# Patient Record
Sex: Female | Born: 1935 | Race: White | Hispanic: No | Smoking: Former smoker
Health system: Southern US, Community
[De-identification: ages and names within clinical notes are randomized; demographics above are authoritative.]

## PROBLEM LIST (undated history)

## (undated) DIAGNOSIS — I4891 Unspecified atrial fibrillation: Secondary | ICD-10-CM

## (undated) DIAGNOSIS — J45909 Unspecified asthma, uncomplicated: Secondary | ICD-10-CM

## (undated) DIAGNOSIS — Z9109 Other allergy status, other than to drugs and biological substances: Secondary | ICD-10-CM

## (undated) DIAGNOSIS — R42 Dizziness and giddiness: Secondary | ICD-10-CM

## (undated) DIAGNOSIS — R05 Cough: Secondary | ICD-10-CM

## (undated) DIAGNOSIS — I10 Essential (primary) hypertension: Secondary | ICD-10-CM

## (undated) DIAGNOSIS — J449 Chronic obstructive pulmonary disease, unspecified: Secondary | ICD-10-CM

## (undated) DIAGNOSIS — R059 Cough, unspecified: Secondary | ICD-10-CM

## (undated) DIAGNOSIS — I509 Heart failure, unspecified: Secondary | ICD-10-CM

## (undated) DIAGNOSIS — K219 Gastro-esophageal reflux disease without esophagitis: Secondary | ICD-10-CM

## (undated) DIAGNOSIS — M1990 Unspecified osteoarthritis, unspecified site: Secondary | ICD-10-CM

## (undated) HISTORY — PX: OTHER SURGICAL HISTORY: SHX169

## (undated) HISTORY — PX: BREAST BIOPSY: SHX20

## (undated) HISTORY — PX: THYROID SURGERY: SHX805

## (undated) HISTORY — PX: LAPAROSCOPIC OOPHERECTOMY: SHX6507

---

## 2004-06-04 ENCOUNTER — Ambulatory Visit: Payer: Self-pay | Admitting: Gastroenterology

## 2004-07-08 ENCOUNTER — Ambulatory Visit: Payer: Self-pay | Admitting: Gastroenterology

## 2005-02-21 ENCOUNTER — Ambulatory Visit: Payer: Self-pay

## 2005-07-09 ENCOUNTER — Emergency Department: Payer: Self-pay | Admitting: Emergency Medicine

## 2005-07-09 ENCOUNTER — Other Ambulatory Visit: Payer: Self-pay

## 2006-03-14 ENCOUNTER — Ambulatory Visit: Payer: Self-pay

## 2006-11-07 ENCOUNTER — Observation Stay: Payer: Self-pay | Admitting: Internal Medicine

## 2006-11-07 ENCOUNTER — Other Ambulatory Visit: Payer: Self-pay

## 2006-11-08 ENCOUNTER — Other Ambulatory Visit: Payer: Self-pay

## 2006-11-09 ENCOUNTER — Other Ambulatory Visit: Payer: Self-pay

## 2006-11-17 ENCOUNTER — Emergency Department: Payer: Self-pay | Admitting: Internal Medicine

## 2007-02-15 ENCOUNTER — Ambulatory Visit: Payer: Self-pay

## 2007-03-21 ENCOUNTER — Ambulatory Visit: Payer: Self-pay

## 2007-04-22 ENCOUNTER — Other Ambulatory Visit: Payer: Self-pay

## 2007-04-22 ENCOUNTER — Emergency Department: Payer: Self-pay | Admitting: Emergency Medicine

## 2007-11-21 ENCOUNTER — Emergency Department: Payer: Self-pay | Admitting: Emergency Medicine

## 2008-01-21 ENCOUNTER — Other Ambulatory Visit: Payer: Self-pay

## 2008-01-21 ENCOUNTER — Emergency Department: Payer: Self-pay | Admitting: Emergency Medicine

## 2008-01-23 ENCOUNTER — Emergency Department: Payer: Self-pay | Admitting: Emergency Medicine

## 2008-06-01 ENCOUNTER — Emergency Department: Payer: Self-pay | Admitting: Emergency Medicine

## 2008-06-18 ENCOUNTER — Ambulatory Visit: Payer: Self-pay | Admitting: Family Medicine

## 2008-06-20 ENCOUNTER — Ambulatory Visit: Payer: Self-pay | Admitting: Family Medicine

## 2008-07-14 ENCOUNTER — Ambulatory Visit: Payer: Self-pay | Admitting: Gynecologic Oncology

## 2008-07-15 ENCOUNTER — Ambulatory Visit: Payer: Self-pay | Admitting: Gynecologic Oncology

## 2008-08-04 ENCOUNTER — Ambulatory Visit: Payer: Self-pay | Admitting: Obstetrics & Gynecology

## 2008-08-12 ENCOUNTER — Ambulatory Visit: Payer: Self-pay | Admitting: Obstetrics & Gynecology

## 2009-08-05 ENCOUNTER — Ambulatory Visit: Payer: Self-pay | Admitting: Internal Medicine

## 2010-09-21 ENCOUNTER — Ambulatory Visit: Payer: Self-pay | Admitting: Internal Medicine

## 2011-08-26 ENCOUNTER — Ambulatory Visit: Payer: Self-pay | Admitting: Gastroenterology

## 2012-02-08 ENCOUNTER — Ambulatory Visit: Payer: Self-pay | Admitting: Internal Medicine

## 2013-02-12 ENCOUNTER — Ambulatory Visit: Payer: Self-pay | Admitting: Internal Medicine

## 2013-06-28 ENCOUNTER — Ambulatory Visit: Payer: Self-pay | Admitting: Unknown Physician Specialty

## 2014-02-24 ENCOUNTER — Ambulatory Visit: Payer: Self-pay | Admitting: Internal Medicine

## 2014-08-19 DIAGNOSIS — M1712 Unilateral primary osteoarthritis, left knee: Secondary | ICD-10-CM | POA: Diagnosis not present

## 2014-08-27 ENCOUNTER — Ambulatory Visit
Admit: 2014-08-27 | Disposition: A | Discharge status: Home / Self Care | Payer: Self-pay | Attending: Unknown Physician Specialty | Admitting: Unknown Physician Specialty

## 2014-08-27 DIAGNOSIS — M1712 Unilateral primary osteoarthritis, left knee: Secondary | ICD-10-CM | POA: Diagnosis not present

## 2014-08-27 DIAGNOSIS — M25862 Other specified joint disorders, left knee: Secondary | ICD-10-CM | POA: Diagnosis not present

## 2014-08-31 DIAGNOSIS — K921 Melena: Secondary | ICD-10-CM | POA: Diagnosis not present

## 2014-09-03 DIAGNOSIS — J40 Bronchitis, not specified as acute or chronic: Secondary | ICD-10-CM | POA: Diagnosis not present

## 2014-09-03 DIAGNOSIS — I10 Essential (primary) hypertension: Secondary | ICD-10-CM | POA: Diagnosis not present

## 2014-09-03 DIAGNOSIS — I4891 Unspecified atrial fibrillation: Secondary | ICD-10-CM | POA: Diagnosis not present

## 2014-09-10 DIAGNOSIS — J40 Bronchitis, not specified as acute or chronic: Secondary | ICD-10-CM | POA: Diagnosis not present

## 2014-09-10 DIAGNOSIS — R05 Cough: Secondary | ICD-10-CM | POA: Diagnosis not present

## 2014-09-10 DIAGNOSIS — J42 Unspecified chronic bronchitis: Secondary | ICD-10-CM | POA: Diagnosis not present

## 2014-09-15 DIAGNOSIS — I480 Paroxysmal atrial fibrillation: Secondary | ICD-10-CM | POA: Diagnosis not present

## 2014-09-15 DIAGNOSIS — R0602 Shortness of breath: Secondary | ICD-10-CM | POA: Diagnosis not present

## 2014-09-15 DIAGNOSIS — I5022 Chronic systolic (congestive) heart failure: Secondary | ICD-10-CM | POA: Diagnosis not present

## 2014-09-15 DIAGNOSIS — R05 Cough: Secondary | ICD-10-CM | POA: Diagnosis not present

## 2014-09-29 DIAGNOSIS — I10 Essential (primary) hypertension: Secondary | ICD-10-CM | POA: Diagnosis not present

## 2014-09-29 DIAGNOSIS — I5022 Chronic systolic (congestive) heart failure: Secondary | ICD-10-CM | POA: Diagnosis not present

## 2014-09-29 DIAGNOSIS — I480 Paroxysmal atrial fibrillation: Secondary | ICD-10-CM | POA: Diagnosis not present

## 2014-09-29 DIAGNOSIS — I340 Nonrheumatic mitral (valve) insufficiency: Secondary | ICD-10-CM | POA: Diagnosis not present

## 2014-09-29 DIAGNOSIS — R0602 Shortness of breath: Secondary | ICD-10-CM | POA: Diagnosis not present

## 2014-10-07 ENCOUNTER — Other Ambulatory Visit: Payer: Self-pay | Admitting: Unknown Physician Specialty

## 2014-10-07 DIAGNOSIS — M1712 Unilateral primary osteoarthritis, left knee: Secondary | ICD-10-CM

## 2014-10-09 ENCOUNTER — Ambulatory Visit: Admission: RE | Admit: 2014-10-09 | Payer: Commercial Managed Care - HMO | Source: Ambulatory Visit

## 2014-10-16 ENCOUNTER — Ambulatory Visit: Admission: RE | Admit: 2014-10-16 | Payer: Commercial Managed Care - HMO | Source: Ambulatory Visit

## 2014-10-23 ENCOUNTER — Encounter
Admission: RE | Admit: 2014-10-23 | Discharge: 2014-10-23 | Disposition: A | Discharge status: Home / Self Care | Payer: Commercial Managed Care - HMO | Source: Ambulatory Visit | Attending: Unknown Physician Specialty | Admitting: Unknown Physician Specialty

## 2014-10-23 DIAGNOSIS — Z809 Family history of malignant neoplasm, unspecified: Secondary | ICD-10-CM | POA: Insufficient documentation

## 2014-10-23 DIAGNOSIS — I10 Essential (primary) hypertension: Secondary | ICD-10-CM | POA: Diagnosis not present

## 2014-10-23 DIAGNOSIS — Z01812 Encounter for preprocedural laboratory examination: Secondary | ICD-10-CM | POA: Diagnosis not present

## 2014-10-23 DIAGNOSIS — K219 Gastro-esophageal reflux disease without esophagitis: Secondary | ICD-10-CM | POA: Insufficient documentation

## 2014-10-23 DIAGNOSIS — I4891 Unspecified atrial fibrillation: Secondary | ICD-10-CM | POA: Insufficient documentation

## 2014-10-23 DIAGNOSIS — Z881 Allergy status to other antibiotic agents status: Secondary | ICD-10-CM | POA: Insufficient documentation

## 2014-10-23 DIAGNOSIS — Z8249 Family history of ischemic heart disease and other diseases of the circulatory system: Secondary | ICD-10-CM | POA: Diagnosis not present

## 2014-10-23 DIAGNOSIS — M1712 Unilateral primary osteoarthritis, left knee: Secondary | ICD-10-CM | POA: Diagnosis not present

## 2014-10-23 DIAGNOSIS — Z888 Allergy status to other drugs, medicaments and biological substances status: Secondary | ICD-10-CM | POA: Insufficient documentation

## 2014-10-23 DIAGNOSIS — J449 Chronic obstructive pulmonary disease, unspecified: Secondary | ICD-10-CM | POA: Diagnosis not present

## 2014-10-23 HISTORY — DX: Gastro-esophageal reflux disease without esophagitis: K21.9

## 2014-10-23 HISTORY — DX: Cough, unspecified: R05.9

## 2014-10-23 HISTORY — DX: Cough: R05

## 2014-10-23 HISTORY — DX: Chronic obstructive pulmonary disease, unspecified: J44.9

## 2014-10-23 HISTORY — DX: Unspecified osteoarthritis, unspecified site: M19.90

## 2014-10-23 HISTORY — DX: Other allergy status, other than to drugs and biological substances: Z91.09

## 2014-10-23 HISTORY — DX: Unspecified atrial fibrillation: I48.91

## 2014-10-23 HISTORY — DX: Essential (primary) hypertension: I10

## 2014-10-23 LAB — CBC
HCT: 36.6 % (ref 35.0–47.0)
HEMOGLOBIN: 12.5 g/dL (ref 12.0–16.0)
MCH: 33.2 pg (ref 26.0–34.0)
MCHC: 34.1 g/dL (ref 32.0–36.0)
MCV: 97.3 fL (ref 80.0–100.0)
PLATELETS: 215 10*3/uL (ref 150–440)
RBC: 3.76 MIL/uL — ABNORMAL LOW (ref 3.80–5.20)
RDW: 12.8 % (ref 11.5–14.5)
WBC: 4 10*3/uL (ref 3.6–11.0)

## 2014-10-23 LAB — SURGICAL PCR SCREEN
MRSA, PCR: NEGATIVE
STAPHYLOCOCCUS AUREUS: NEGATIVE

## 2014-10-23 LAB — URINALYSIS COMPLETE WITH MICROSCOPIC (ARMC ONLY)
Bacteria, UA: NONE SEEN
Bilirubin Urine: NEGATIVE
Glucose, UA: NEGATIVE mg/dL
Hgb urine dipstick: NEGATIVE
Ketones, ur: NEGATIVE mg/dL
LEUKOCYTES UA: NEGATIVE
Nitrite: NEGATIVE
PROTEIN: NEGATIVE mg/dL
SPECIFIC GRAVITY, URINE: 1.006 (ref 1.005–1.030)
pH: 7 (ref 5.0–8.0)

## 2014-10-23 LAB — BASIC METABOLIC PANEL
Anion gap: 9 (ref 5–15)
BUN: 13 mg/dL (ref 6–20)
CHLORIDE: 99 mmol/L — AB (ref 101–111)
CO2: 29 mmol/L (ref 22–32)
Calcium: 9.7 mg/dL (ref 8.9–10.3)
Creatinine, Ser: 0.66 mg/dL (ref 0.44–1.00)
GFR calc Af Amer: >60 mL/min (ref >60)
Glucose, Bld: 103 mg/dL — ABNORMAL HIGH (ref 65–99)
Potassium: 3.4 mmol/L — ABNORMAL LOW (ref 3.5–5.1)
SODIUM: 137 mmol/L (ref 135–145)

## 2014-10-23 LAB — APTT: APTT: 30 s (ref 24–36)

## 2014-10-23 LAB — ABO/RH: ABO/RH(D): A POS

## 2014-10-23 LAB — PROTIME-INR
INR: 1.01
Prothrombin Time: 13.5 seconds (ref 11.4–15.0)

## 2014-10-23 NOTE — Patient Instructions (Signed)
  Your procedure is scheduled on: 11/05/14 Wed Report to Day Surgery. To find out your arrival time please call [phone] between 1PM - 3PM on 11/04/14 Tues.  Remember: Instructions that are not followed completely may result in serious medical risk, up to and including death, or upon the discretion of your surgeon and anesthesiologist your surgery may need to be rescheduled.    _x___ 1. Do not eat food or drink liquids after midnight. No gum chewing or hard candies.     ____ 2. No Alcohol for 24 hours before or after surgery.   ____ 3. Bring all medications with you on the day of surgery if instructed.    __x__ 4. Notify your doctor if there is any change in your medical condition     (cold, fever, infections).     Do not wear jewelry, make-up, hairpins, clips or nail polish.  Do not wear lotions, powders, or perfumes. You may wear deodorant.  Do not shave 48 hours prior to surgery. Men may shave face and neck.  Do not bring valuables to the hospital.    [facility] is not responsible for any belongings or valuables.               Contacts, dentures or bridgework may not be worn into surgery.  Leave your suitcase in the car. After surgery it may be brought to your room.  For patients admitted to the hospital, discharge time is determined by your                treatment team.   Patients discharged the day of surgery will not be allowed to drive home.   Please read over the following fact sheets that you were given:   MRSA Information   ____ Take these medicines the morning of surgery with A SIP OF WATER:    1. busPIRone (BUSPAR) 5 MG tablet  2. digoxin (LANOXIN) 0.25 MG tablet  3. metoprolol succinate (TOPROL-XL) 50 MG 24 hr tablet  4.pantoprazole (PROTONIX) 40 MG tablet  5.  6.  ____ Fleet Enema (as directed)   _x__ Use CHG Soap as directed  ____ Use inhalers on the day of surgery  ____ Stop metformin 2 days prior to surgery    ____ Take 1/2 of usual insulin dose  the night before surgery and none on the morning of surgery.   ____ Stop Coumadin/Plavix/aspirin on stop aspirin 7 days before surgery  ____ Stop Anti-inflammatories on    ____ Stop supplements until after surgery.    ____ Bring C-Pap to the hospital.

## 2014-11-05 ENCOUNTER — Inpatient Hospital Stay: Payer: Commercial Managed Care - HMO | Admitting: Certified Registered Nurse Anesthetist

## 2014-11-05 ENCOUNTER — Inpatient Hospital Stay: Payer: Commercial Managed Care - HMO

## 2014-11-05 ENCOUNTER — Inpatient Hospital Stay
Admission: RE | Admit: 2014-11-05 | Discharge: 2014-11-08 | DRG: 470 | Disposition: A | Discharge status: Skilled Nursing Facility | Payer: Commercial Managed Care - HMO | Source: Ambulatory Visit | Attending: Unknown Physician Specialty | Admitting: Unknown Physician Specialty

## 2014-11-05 ENCOUNTER — Encounter: Payer: Self-pay | Admitting: *Deleted

## 2014-11-05 ENCOUNTER — Encounter
Admission: RE | Disposition: A | Discharge status: Skilled Nursing Facility | Payer: Self-pay | Source: Ambulatory Visit | Attending: Unknown Physician Specialty

## 2014-11-05 DIAGNOSIS — M1712 Unilateral primary osteoarthritis, left knee: Principal | ICD-10-CM | POA: Diagnosis present

## 2014-11-05 DIAGNOSIS — E039 Hypothyroidism, unspecified: Secondary | ICD-10-CM | POA: Diagnosis present

## 2014-11-05 DIAGNOSIS — E871 Hypo-osmolality and hyponatremia: Secondary | ICD-10-CM | POA: Diagnosis not present

## 2014-11-05 DIAGNOSIS — I4891 Unspecified atrial fibrillation: Secondary | ICD-10-CM | POA: Diagnosis present

## 2014-11-05 DIAGNOSIS — M810 Age-related osteoporosis without current pathological fracture: Secondary | ICD-10-CM | POA: Diagnosis not present

## 2014-11-05 DIAGNOSIS — Z419 Encounter for procedure for purposes other than remedying health state, unspecified: Secondary | ICD-10-CM

## 2014-11-05 DIAGNOSIS — Z96659 Presence of unspecified artificial knee joint: Secondary | ICD-10-CM

## 2014-11-05 DIAGNOSIS — M6281 Muscle weakness (generalized): Secondary | ICD-10-CM | POA: Diagnosis not present

## 2014-11-05 DIAGNOSIS — E119 Type 2 diabetes mellitus without complications: Secondary | ICD-10-CM | POA: Diagnosis not present

## 2014-11-05 DIAGNOSIS — M1990 Unspecified osteoarthritis, unspecified site: Secondary | ICD-10-CM | POA: Diagnosis not present

## 2014-11-05 DIAGNOSIS — Z96652 Presence of left artificial knee joint: Secondary | ICD-10-CM | POA: Diagnosis not present

## 2014-11-05 DIAGNOSIS — Z87891 Personal history of nicotine dependence: Secondary | ICD-10-CM

## 2014-11-05 DIAGNOSIS — Z7982 Long term (current) use of aspirin: Secondary | ICD-10-CM | POA: Diagnosis not present

## 2014-11-05 DIAGNOSIS — J449 Chronic obstructive pulmonary disease, unspecified: Secondary | ICD-10-CM | POA: Diagnosis present

## 2014-11-05 DIAGNOSIS — R2689 Other abnormalities of gait and mobility: Secondary | ICD-10-CM | POA: Diagnosis not present

## 2014-11-05 DIAGNOSIS — M179 Osteoarthritis of knee, unspecified: Secondary | ICD-10-CM | POA: Diagnosis not present

## 2014-11-05 DIAGNOSIS — K219 Gastro-esophageal reflux disease without esophagitis: Secondary | ICD-10-CM | POA: Diagnosis not present

## 2014-11-05 DIAGNOSIS — I10 Essential (primary) hypertension: Secondary | ICD-10-CM | POA: Diagnosis not present

## 2014-11-05 DIAGNOSIS — Z471 Aftercare following joint replacement surgery: Secondary | ICD-10-CM | POA: Diagnosis not present

## 2014-11-05 HISTORY — PX: TOTAL KNEE ARTHROPLASTY: SHX125

## 2014-11-05 LAB — TYPE AND SCREEN
ABO/RH(D): A POS
ANTIBODY SCREEN: NEGATIVE
Unit division: 0
Unit division: 0

## 2014-11-05 LAB — CREATININE, SERUM
Creatinine, Ser: 0.66 mg/dL (ref 0.44–1.00)
GFR calc Af Amer: >60 mL/min (ref >60)

## 2014-11-05 LAB — CBC
HCT: 35.5 % (ref 35.0–47.0)
Hemoglobin: 12.2 g/dL (ref 12.0–16.0)
MCH: 33.8 pg (ref 26.0–34.0)
MCHC: 34.4 g/dL (ref 32.0–36.0)
MCV: 98.5 fL (ref 80.0–100.0)
Platelets: 200 10*3/uL (ref 150–440)
RBC: 3.6 MIL/uL — AB (ref 3.80–5.20)
RDW: 13.3 % (ref 11.5–14.5)
WBC: 10.6 10*3/uL (ref 3.6–11.0)

## 2014-11-05 LAB — PREPARE RBC (CROSSMATCH)

## 2014-11-05 SURGERY — ARTHROPLASTY, KNEE, TOTAL
Anesthesia: Spinal | Laterality: Left | Wound class: Clean

## 2014-11-05 MED ORDER — PHENOL 1.4 % MT LIQD: 1.0000 | OROMUCOSAL | Status: DC | PRN

## 2014-11-05 MED ORDER — POLYETHYLENE GLYCOL 3350 17 G PO PACK
17.0000 g | PACK | Freq: Every day | ORAL | Status: DC | PRN
  Administered 2014-11-06 – 2014-11-07 (×2): 17 g via ORAL
  Filled 2014-11-05 (×2): qty 1

## 2014-11-05 MED ORDER — ONDANSETRON HCL 4 MG PO TABS: 4.0000 mg | ORAL_TABLET | Freq: Four times a day (QID) | ORAL | Status: DC | PRN

## 2014-11-05 MED ORDER — RALOXIFENE HCL 60 MG PO TABS
60.0000 mg | ORAL_TABLET | Freq: Every day | ORAL | Status: DC
  Administered 2014-11-05 – 2014-11-08 (×4): 60 mg via ORAL
  Filled 2014-11-05 (×4): qty 1

## 2014-11-05 MED ORDER — FENTANYL CITRATE (PF) 100 MCG/2ML IJ SOLN
25.0000 ug | INTRAMUSCULAR | Status: DC | PRN
  Administered 2014-11-05 (×4): 25 ug via INTRAVENOUS

## 2014-11-05 MED ORDER — KCL IN DEXTROSE-NACL 20-5-0.45 MEQ/L-%-% IV SOLN
INTRAVENOUS | Status: DC
  Administered 2014-11-05 – 2014-11-06 (×2): via INTRAVENOUS
  Filled 2014-11-05 (×4): qty 1000

## 2014-11-05 MED ORDER — SODIUM CHLORIDE 0.9 % IJ SOLN
INTRAMUSCULAR | Status: AC
  Filled 2014-11-05: qty 50

## 2014-11-05 MED ORDER — ACETAMINOPHEN 650 MG RE SUPP: 650.0000 mg | Freq: Four times a day (QID) | RECTAL | Status: DC | PRN

## 2014-11-05 MED ORDER — TRANEXAMIC ACID 1000 MG/10ML IV SOLN: 1000.0000 mg | INTRAVENOUS | Status: DC | PRN

## 2014-11-05 MED ORDER — CEFAZOLIN SODIUM 1-5 GM-% IV SOLN
INTRAVENOUS | Status: AC
  Filled 2014-11-05: qty 50

## 2014-11-05 MED ORDER — LIDOCAINE HCL (CARDIAC) 20 MG/ML IV SOLN
INTRAVENOUS | Status: DC | PRN
  Administered 2014-11-05: 60 mg via INTRAVENOUS

## 2014-11-05 MED ORDER — FENTANYL CITRATE (PF) 100 MCG/2ML IJ SOLN
INTRAMUSCULAR | Status: DC | PRN
  Administered 2014-11-05 (×4): 50 ug via INTRAVENOUS

## 2014-11-05 MED ORDER — TRANEXAMIC ACID 1000 MG/10ML IV SOLN
INTRAVENOUS | Status: AC
  Filled 2014-11-05: qty 10

## 2014-11-05 MED ORDER — ONDANSETRON HCL 4 MG/2ML IJ SOLN
4.0000 mg | Freq: Four times a day (QID) | INTRAMUSCULAR | Status: DC | PRN
  Administered 2014-11-05 – 2014-11-06 (×2): 4 mg via INTRAVENOUS
  Filled 2014-11-05: qty 2

## 2014-11-05 MED ORDER — BUPIVACAINE LIPOSOME 1.3 % IJ SUSP
INTRAMUSCULAR | Status: AC
  Filled 2014-11-05: qty 20

## 2014-11-05 MED ORDER — ACETAMINOPHEN 325 MG PO TABS
650.0000 mg | ORAL_TABLET | Freq: Four times a day (QID) | ORAL | Status: DC | PRN
  Administered 2014-11-05 – 2014-11-08 (×3): 650 mg via ORAL
  Filled 2014-11-05 (×3): qty 2

## 2014-11-05 MED ORDER — BUPIVACAINE-EPINEPHRINE (PF) 0.5% -1:200000 IJ SOLN
INTRAMUSCULAR | Status: AC
  Filled 2014-11-05: qty 30

## 2014-11-05 MED ORDER — HYDROMORPHONE HCL 1 MG/ML IJ SOLN
0.5000 mg | INTRAMUSCULAR | Status: AC | PRN
  Administered 2014-11-05 (×4): 0.5 mg via INTRAVENOUS

## 2014-11-05 MED ORDER — ONDANSETRON HCL 4 MG/2ML IJ SOLN: 4.0000 mg | Freq: Once | INTRAMUSCULAR | Status: DC | PRN

## 2014-11-05 MED ORDER — CEFAZOLIN SODIUM 1-5 GM-% IV SOLN
INTRAVENOUS | Status: AC
  Administered 2014-11-05: 1 g via INTRAVENOUS
  Filled 2014-11-05: qty 50

## 2014-11-05 MED ORDER — BENZONATATE 100 MG PO CAPS: 100.0000 mg | ORAL_CAPSULE | Freq: Three times a day (TID) | ORAL | Status: DC | PRN

## 2014-11-05 MED ORDER — GLYCOPYRROLATE 0.2 MG/ML IJ SOLN
INTRAMUSCULAR | Status: DC | PRN
  Administered 2014-11-05 (×2): 0.1 mg via INTRAVENOUS

## 2014-11-05 MED ORDER — HYDROMORPHONE HCL 1 MG/ML IJ SOLN
INTRAMUSCULAR | Status: AC
  Administered 2014-11-05: 0.5 mg via INTRAVENOUS
  Filled 2014-11-05: qty 1

## 2014-11-05 MED ORDER — DIGOXIN 125 MCG PO TABS
0.2500 mg | ORAL_TABLET | Freq: Every day | ORAL | Status: DC
  Administered 2014-11-06 – 2014-11-08 (×3): 0.25 mg via ORAL
  Filled 2014-11-05 (×3): qty 2

## 2014-11-05 MED ORDER — OXYCODONE HCL 5 MG PO TABS
5.0000 mg | ORAL_TABLET | ORAL | Status: DC | PRN
  Administered 2014-11-05 – 2014-11-06 (×2): 5 mg via ORAL
  Administered 2014-11-06: 10 mg via ORAL
  Administered 2014-11-06: 5 mg via ORAL
  Administered 2014-11-06 (×2): 10 mg via ORAL
  Administered 2014-11-07 – 2014-11-08 (×6): 5 mg via ORAL
  Filled 2014-11-05 (×4): qty 1
  Filled 2014-11-05 (×2): qty 2
  Filled 2014-11-05 (×3): qty 1
  Filled 2014-11-05: qty 2
  Filled 2014-11-05: qty 1
  Filled 2014-11-05: qty 2
  Filled 2014-11-05: qty 1

## 2014-11-05 MED ORDER — ENOXAPARIN SODIUM 30 MG/0.3ML ~~LOC~~ SOLN
30.0000 mg | Freq: Two times a day (BID) | SUBCUTANEOUS | Status: DC
  Administered 2014-11-06 – 2014-11-08 (×5): 30 mg via SUBCUTANEOUS
  Filled 2014-11-05 (×5): qty 0.3

## 2014-11-05 MED ORDER — BUPIVACAINE LIPOSOME 1.3 % IJ SUSP
INTRAMUSCULAR | Status: DC | PRN
  Administered 2014-11-05: 60 mL

## 2014-11-05 MED ORDER — ACETAMINOPHEN 10 MG/ML IV SOLN
INTRAVENOUS | Status: AC
  Filled 2014-11-05: qty 100

## 2014-11-05 MED ORDER — NEOMYCIN-POLYMYXIN B GU 40-200000 IR SOLN
Status: DC | PRN
  Administered 2014-11-05: 16 mL

## 2014-11-05 MED ORDER — SODIUM CHLORIDE 0.9 % IV SOLN
10000.0000 ug | INTRAVENOUS | Status: DC | PRN
  Administered 2014-11-05: 50 ug/min via INTRAVENOUS

## 2014-11-05 MED ORDER — NEOMYCIN-POLYMYXIN B GU 40-200000 IR SOLN
Status: AC
  Filled 2014-11-05: qty 20

## 2014-11-05 MED ORDER — ALPRAZOLAM 0.25 MG PO TABS
0.2500 mg | ORAL_TABLET | Freq: Two times a day (BID) | ORAL | Status: DC | PRN
  Administered 2014-11-06: 0.25 mg via ORAL
  Filled 2014-11-05: qty 1

## 2014-11-05 MED ORDER — TRANEXAMIC ACID 1000 MG/10ML IV SOLN
1000.0000 mg | INTRAVENOUS | Status: DC | PRN
  Administered 2014-11-05: 1000 mg via INTRAVENOUS

## 2014-11-05 MED ORDER — ACETAMINOPHEN 10 MG/ML IV SOLN
INTRAVENOUS | Status: DC | PRN
  Administered 2014-11-05: 1000 mg via INTRAVENOUS

## 2014-11-05 MED ORDER — CEFAZOLIN SODIUM 1-5 GM-% IV SOLN
1.0000 g | Freq: Three times a day (TID) | INTRAVENOUS | Status: AC
  Administered 2014-11-05 – 2014-11-06 (×3): 1 g via INTRAVENOUS
  Filled 2014-11-05 (×3): qty 50

## 2014-11-05 MED ORDER — BUPIVACAINE IN DEXTROSE 0.75-8.25 % IT SOLN
INTRATHECAL | Status: DC | PRN
  Administered 2014-11-05: 2 mL via INTRATHECAL

## 2014-11-05 MED ORDER — BUSPIRONE HCL 10 MG PO TABS
5.0000 mg | ORAL_TABLET | Freq: Two times a day (BID) | ORAL | Status: DC
Start: 2014-11-05 — End: 2014-11-08
  Administered 2014-11-05 – 2014-11-08 (×6): 5 mg via ORAL
  Filled 2014-11-05 (×6): qty 1

## 2014-11-05 MED ORDER — HYDROMORPHONE HCL 1 MG/ML IJ SOLN
1.0000 mg | INTRAMUSCULAR | Status: DC | PRN
  Administered 2014-11-05: 1 mg via INTRAVENOUS
  Filled 2014-11-05: qty 1

## 2014-11-05 MED ORDER — LOSARTAN POTASSIUM 50 MG PO TABS
100.0000 mg | ORAL_TABLET | Freq: Every day | ORAL | Status: DC
  Administered 2014-11-05 – 2014-11-08 (×4): 100 mg via ORAL
  Filled 2014-11-05 (×4): qty 2

## 2014-11-05 MED ORDER — HYDROCHLOROTHIAZIDE 12.5 MG PO CAPS
12.5000 mg | ORAL_CAPSULE | Freq: Every day | ORAL | Status: DC
  Administered 2014-11-05 – 2014-11-08 (×4): 12.5 mg via ORAL
  Filled 2014-11-05 (×4): qty 1

## 2014-11-05 MED ORDER — LOSARTAN POTASSIUM-HCTZ 100-12.5 MG PO TABS: 1.0000 | ORAL_TABLET | Freq: Every day | ORAL | Status: DC

## 2014-11-05 MED ORDER — MENTHOL 3 MG MT LOZG: 1.0000 | LOZENGE | OROMUCOSAL | Status: DC | PRN

## 2014-11-05 MED ORDER — PANTOPRAZOLE SODIUM 40 MG PO TBEC
40.0000 mg | DELAYED_RELEASE_TABLET | Freq: Every day | ORAL | Status: DC
  Administered 2014-11-06 – 2014-11-08 (×3): 40 mg via ORAL
  Filled 2014-11-05 (×3): qty 1

## 2014-11-05 MED ORDER — LACTATED RINGERS IV SOLN
INTRAVENOUS | Status: DC
  Administered 2014-11-05: 07:00:00 via INTRAVENOUS

## 2014-11-05 MED ORDER — PROPOFOL INFUSION 10 MG/ML OPTIME
INTRAVENOUS | Status: DC | PRN
  Administered 2014-11-05: 50 ug/kg/min via INTRAVENOUS

## 2014-11-05 MED ORDER — METOPROLOL SUCCINATE ER 50 MG PO TB24
50.0000 mg | ORAL_TABLET | Freq: Every day | ORAL | Status: DC
  Administered 2014-11-06 – 2014-11-08 (×3): 50 mg via ORAL
  Filled 2014-11-05 (×3): qty 1

## 2014-11-05 MED ORDER — FENTANYL CITRATE (PF) 100 MCG/2ML IJ SOLN
INTRAMUSCULAR | Status: AC
  Administered 2014-11-05: 25 ug via INTRAVENOUS
  Filled 2014-11-05: qty 2

## 2014-11-05 SURGICAL SUPPLY — 53 items
BLADE SAGITTAL AGGR TOOTH XLG (BLADE) ×3 IMPLANT
BLADE SAW 1/2 (BLADE) ×3 IMPLANT
BLADE SAW SAG 29X58X.64 (BLADE) ×3 IMPLANT
BLADE SURG 15 STRL LF DISP TIS (BLADE) ×1 IMPLANT
BLADE SURG 15 STRL SS (BLADE) ×2
BNDG COHESIVE 6X5 TAN STRL LF (GAUZE/BANDAGES/DRESSINGS) ×3 IMPLANT
BOWL CEMENT MIXING ADV NOZZLE (MISCELLANEOUS) ×3 IMPLANT
CANISTER SUCT 1200ML W/VALVE (MISCELLANEOUS) ×3 IMPLANT
CANISTER SUCT 3000ML (MISCELLANEOUS) ×6 IMPLANT
CAPT KNEE TOTAL 3 ×3 IMPLANT
CATH TRAY 16F METER LATEX (MISCELLANEOUS) ×3 IMPLANT
CEMENT BONE GENTAMICIN (Cement) ×6 IMPLANT
CEMENT BONE GENTAMICIN PWDR (Cement) ×2 IMPLANT
CHLORAPREP W/TINT 26ML (MISCELLANEOUS) ×6 IMPLANT
COOLER POLAR GLACIER W/PUMP (MISCELLANEOUS) ×3 IMPLANT
DRAPE INCISE IOBAN 66X45 STRL (DRAPES) ×3 IMPLANT
DRAPE SHEET LG 3/4 BI-LAMINATE (DRAPES) ×3 IMPLANT
GAUZE PETRO XEROFOAM 1X8 (MISCELLANEOUS) ×3 IMPLANT
GAUZE SPONGE 4X4 12PLY STRL (GAUZE/BANDAGES/DRESSINGS) ×3 IMPLANT
GLOVE BIO SURGEON STRL SZ8 (GLOVE) ×12 IMPLANT
GLOVE BIOGEL M STRL SZ7.5 (GLOVE) ×6 IMPLANT
GLOVE INDICATOR 8.0 STRL GRN (GLOVE) ×6 IMPLANT
GOWN STRL REUS W/ TWL LRG LVL3 (GOWN DISPOSABLE) ×2 IMPLANT
GOWN STRL REUS W/TWL LRG LVL3 (GOWN DISPOSABLE) ×4
GOWN STRL REUS W/TWL LRG LVL4 (GOWN DISPOSABLE) ×6 IMPLANT
HANDPIECE SUCTION TUBG SURGILV (MISCELLANEOUS) ×3 IMPLANT
HOOD PEEL AWAY FACE SHEILD DIS (HOOD) ×6 IMPLANT
IMMBOLIZER KNEE 19 BLUE UNIV (SOFTGOODS) ×3 IMPLANT
IRRIGATION STRYKERFLOW (MISCELLANEOUS) ×1 IMPLANT
IRRIGATOR STRYKERFLOW (MISCELLANEOUS) ×3
KIT RM TURNOVER STRD PROC AR (KITS) ×3 IMPLANT
NDL SAFETY 18GX1.5 (NEEDLE) ×3 IMPLANT
NDL SAFETY 22GX1.5 (NEEDLE) ×3 IMPLANT
NEEDLE SPNL 20GX3.5 QUINCKE YW (NEEDLE) ×6 IMPLANT
NS IRRIG 1000ML POUR BTL (IV SOLUTION) ×3 IMPLANT
PACK TOTAL KNEE (MISCELLANEOUS) ×3 IMPLANT
PAD ABD DERMACEA PRESS 5X9 (GAUZE/BANDAGES/DRESSINGS) ×3 IMPLANT
PAD GROUND ADULT SPLIT (MISCELLANEOUS) ×3 IMPLANT
PAD WRAPON POLAR KNEE (MISCELLANEOUS) ×1 IMPLANT
SOL .9 NS 3000ML IRR  AL (IV SOLUTION) ×2
SOL .9 NS 3000ML IRR UROMATIC (IV SOLUTION) ×1 IMPLANT
STAPLER SKIN PROX 35W (STAPLE) ×3 IMPLANT
SUCTION FRAZIER TIP 10 FR DISP (SUCTIONS) ×6 IMPLANT
SUT ETHIBOND NAB CT1 #1 30IN (SUTURE) ×9 IMPLANT
SUT VIC AB 0 CT1 27 (SUTURE) ×2
SUT VIC AB 0 CT1 27XCR 8 STRN (SUTURE) ×1 IMPLANT
SUT VIC AB 2-0 CT1 27 (SUTURE) ×4
SUT VIC AB 2-0 CT1 TAPERPNT 27 (SUTURE) ×2 IMPLANT
SYR 20CC LL (SYRINGE) ×3 IMPLANT
SYR 30ML LL (SYRINGE) ×3 IMPLANT
SYR 50ML LL SCALE MARK (SYRINGE) ×3 IMPLANT
WATER STERILE IRR 1000ML POUR (IV SOLUTION) ×3 IMPLANT
WRAPON POLAR PAD KNEE (MISCELLANEOUS) ×3

## 2014-11-05 NOTE — H&P (Signed)
  H and P reviewed. No changes. Uploaded at later date. 

## 2014-11-05 NOTE — Op Note (Signed)
DATE OF SURGERY:  11/05/2014  PATIENT NAME:  Natalie Schneider   DOB: [birth year]  MRN: [id number]  PRE-OPERATIVE DIAGNOSIS: Degenerative arthrosis of the left knee, primary  POST-OPERATIVE DIAGNOSIS:  Degenerative arthrosis of the left knee, primary.  PROCEDURE:  Left total knee arthroplasty  SURGEON:  Dr.Chanel Mckesson Royann Shivers. M.D.  ASSISTANT:    ANESTHESIA: Spinal  ESTIMATED BLOOD LOSS: 50  mL  TOURNIQUET TIME: 124 minutes   IMPLANTS UTILIZED: Stryker triathlon #3 posterior stabilized femoral component, #3 universal tibial baseplate, #3 nine millimeter tibial insert, A29 mm by  9mm asymmetric patella  INDICATIONS FOR SURGERY: Natalie Schneider is a 81 y.o. year old female with a long history of progressive knee pain. X-rays demonstrated severe degenerative changes in tricompartmental fashion. She had not seen any significant improvement despite conservative nonsurgical intervention. After discussion of the risks and benefits of surgical intervention, the patient expressed understanding of the risks benefits and agree with plans for total knee arthroplasty.   The risks, benefits, and alternatives were discussed at length including but not limited to the risks of infection, bleeding, nerve injury, stiffness, blood clots, the need for revision surgery, cardiopulmonary complications, among others, and they were willing to proceed.  PROCEDURE:   The patient was taken to the operating room where satisfactory spinal anesthesia was achieved. A Foley catheter was inserted. The patient was given 1 g of Kefzol IV prior to start of the procedure. A tourniquet was applied to the left upper thigh. The left lower extremity was prepped and draped in the usual fashion for a total knee procedure. The left lower extremity was then exsanguinated, and the tourniquet was inflated. A medial parapatella incision was made. Dissection carried down through the subcutaneous tissue onto his capsule. This was divided in  line with the incision. The knee was then inspected. There was significant erosion of the medial and lateral tibial plateaus. There was moderate trochlear groove erosion and moderate retropatellar erosion. I went ahead and drilled a hole in the intercondylar notch. Intramedullary rod was inserted. On the rod was the distal femoral cutting block. It was positioned against the distal femur. It was set for  5 degree valgus cut. The cutting block was fixed to the distal femur with smooth pins and then the intramedullary rod was removed. The distal femoral cut was made, removing 10mm of bone. I then went ahead and removed some medial and lateral compartment meniscal remnants. ACL and PCL were excised. The knee was hyperflexed. External alignment jig was placed on the left leg and set for a cut  2mm below the lateral tibial plateau. The cut was sloped 3 degrees. I went ahead and made this cut without difficulty. I removed the cut bone from the proximal tibial which gave me a flush cut across the tibial surface. I then inserted a  gap spacer with the knee extended, and indeed it was felt that we could easily fit a 9 mm spacer into the extension space. The knee was hyperflexed. The femoral sizing component was placed on the distal femur. It was lined up with the epicondylar axis. It was felt that a #3 femoral component was going to be the appropriate size. Holes were made for this component through the sizing guide, and then the sizing guide was removed. The four-in-one cutting block was impacted onto the distal femur. Anterior and posterior cuts were made along with the anterior and posterior chamfer cuts. I went ahead and sized the proximal tibia for a  #3  base plate. With the  femoral component in place and the  base plate in place, I was able to insert a 9 mm trial tibial-bearing insert. With the inserts in place, the knee fully extended and flexed well.. I then  resected about 8 mm of bone from retropatellar surface. I  made peg holes for the patellar implant. Trial patella was inserted. It tracked fairly well after an abbreviated intra-articular lateral release was performed.   The trials were removed. The tibial base plate was positioned appropriately for the proximal tibial cruciate cut. The proximal tibial cruciate cut ws made, and then a small reamer was used to ream into the proximal tibia. Next, I went ahead and drilled 2 holes through the femoral trial component for  the pegs on the permanent femoral component.    I then sequentially impacted the components. The #3  tibial base plate was impacted on the proximal tibia with antibiotic-impregnated methyl methacrylate. Excess glue ws removed. I then impacted the 3 femoral component onto the distal femur with methyl methacrylate. Again, excess glue was removed. I then inserted a trial 9 mm spacer. The knee was extended.  I went ahead and applied an 79mm thick asymmetric patella to the retropatellar surface with methyl methacrylate. Again, excess glue was removed. After the glue had set up I went ahead and removed the  trial tibial spacer and inserted a permanent 9 mm tibial spacer.  The knee came to full extension and flexed well. The knee seemed to be reasonably stable.   Tourniquet was released at this time.  Tourniquet was up for about 124 minutes.  Bleeding was controlled with coagulation cautery. The wound was irrigated with GU irrigant.  I also injected the capsule and subcutaneous tissue with about 60 cc mixture of saline andExparel . I went ahead and closed the capsule with #2 ethibond sutures , subcu with 3-0 and 2-0 Vicryl, and skin with skin staples. I injected 10 mL of TXA into the joint after the closure. Subsequent to the closure, I did go ahead and apply a sterile dressing and 4 TENS pads. Polar Care cooling pad was applied.  The patient was then transferred to a stretcher bed and taken to the recovery room in satisfactory condition. Blood loss was about  50 cc.      The patient tolerated the procedure well and was transported to the recovery room in stable condition.    Dr.Vaniah Chambers Royann Shivers. M.D.

## 2014-11-05 NOTE — Transfer of Care (Signed)
Immediate Anesthesia Transfer of Care Note  Patient: Natalie Schneider  Procedure(s) Performed: Procedure(s): TOTAL KNEE ARTHROPLASTY (Left)  Patient Location: PACU  Anesthesia Type:Spinal  Level of Consciousness: awake, alert  and oriented  Airway & Oxygen Therapy: Patient Spontanous Breathing and Patient connected to face mask oxygen  Post-op Assessment: Report given to RN and Post -op Vital signs reviewed and stable  Post vital signs: Reviewed and stable  Last Vitals:  Filed Vitals:   11/05/14 1127  BP: 111/56  Pulse: 54  Temp: 36.3 C  Resp: 20    Complications: No apparent anesthesia complications

## 2014-11-05 NOTE — Progress Notes (Signed)
S/P Left total knee, dressing dry/clean/intact/with polar care/tens unit in place. Maintain post of knee protocol, No complain of left knee pain, had one episode of emesis -given zofran iv per order- with some relief. Started on clear liq for dinner- tolerated well.On oxygen at 3L post surgery- weaned oxygen from 3L to 1L sat 98% at 1L.  Given Tylenol for headache- with relief.

## 2014-11-05 NOTE — Anesthesia Preprocedure Evaluation (Addendum)
Anesthesia Evaluation  Patient identified by MRN, date of birth, ID band Patient awake    Reviewed: Allergy & Precautions, NPO status   History of Anesthesia Complications Negative for: history of anesthetic complications  Airway Mallampati: III       Dental   Pulmonary COPDformer smoker,          Cardiovascular Exercise Tolerance: Poor hypertension, Pt. on medications     Neuro/Psych negative neurological ROS  negative psych ROS   GI/Hepatic Neg liver ROS, GERD-  ,  Endo/Other  Hypothyroidism   Renal/GU negative Renal ROS  negative genitourinary   Musculoskeletal  (+) Arthritis -,   Abdominal   Peds negative pediatric ROS (+)  Hematology negative hematology ROS (+)   Anesthesia Other Findings   Reproductive/Obstetrics negative OB ROS                            Anesthesia Physical Anesthesia Plan  ASA: III  Anesthesia Plan: Spinal   Post-op Pain Management:    Induction: Intravenous  Airway Management Planned: Nasal Cannula  Additional Equipment:   Intra-op Plan:   Post-operative Plan:   Informed Consent: I have reviewed the patients History and Physical, chart, labs and discussed the procedure including the risks, benefits and alternatives for the proposed anesthesia with the patient or authorized representative who has indicated his/her understanding and acceptance.     Plan Discussed with: CRNA  Anesthesia Plan Comments:         Anesthesia Quick Evaluation

## 2014-11-05 NOTE — Anesthesia Postprocedure Evaluation (Signed)
  Anesthesia Post-op Note  Patient: Natalie Schneider  Procedure(s) Performed: Procedure(s): TOTAL KNEE ARTHROPLASTY (Left)  Anesthesia type:Spinal  Patient location: PACU  Post pain: Pain level controlled  Post assessment: Post-op Vital signs reviewed, Patient's Cardiovascular Status Stable, Respiratory Function Stable, Patent Airway and No signs of Nausea or vomiting  Post vital signs: Reviewed and stable  Last Vitals:  Filed Vitals:   11/05/14 1127  BP: 111/56  Pulse: 54  Temp: 36.3 C  Resp: 20    Level of consciousness: awake, alert  and patient cooperative  Complications: No apparent anesthesia complications

## 2014-11-05 NOTE — Anesthesia Postprocedure Evaluation (Signed)
  Anesthesia Post-op Note  Patient: Natalie Schneider  Procedure(s) Performed: Procedure(s): TOTAL KNEE ARTHROPLASTY (Left)  Anesthesia type:Spinal  Patient location: PACU  Post pain: Pain level controlled  Post assessment: Post-op Vital signs reviewed, Patient's Cardiovascular Status Stable, Respiratory Function Stable, Patent Airway and No signs of Nausea or vomiting  Post vital signs: Reviewed and stable  Last Vitals:  Filed Vitals:   11/05/14 1127  BP: 111/56  Pulse: 54  Temp: 36.3 C  Resp: 20    Level of consciousness: awake, alert  and patient cooperative  Complications: No apparent anesthesia complications  

## 2014-11-05 NOTE — Anesthesia Procedure Notes (Addendum)
Spinal Patient location during procedure: OR Start time: 11/05/2014 7:40 AM End time: 11/05/2014 7:48 AM Staffing Anesthesiologist: Elijio Miles F Performed by: anesthesiologist  Preanesthetic Checklist Completed: patient identified, site marked, surgical consent, pre-op evaluation, timeout performed, IV checked, risks and benefits discussed and monitors and equipment checked Spinal Block Patient position: sitting Prep: Betadine Patient monitoring: cardiac monitor, continuous pulse ox and blood pressure Approach: midline Location: L3-4 Injection technique: single-shot Needle Needle type: Quincke  Needle gauge: 25 G Needle length: 9 cm Needle insertion depth: 6 cm Assessment Sensory level: T8  Performed by: Malva Cogan Pre-anesthesia Checklist: Emergency Drugs available, Patient identified, Suction available, Patient being monitored and Timeout performed Oxygen Delivery Method: Simple face mask Placement Confirmation: positive ETCO2

## 2014-11-05 NOTE — OR Nursing (Signed)
Patient reports her allergy to augmentin is not the ampicillin. She reports she is able to take ampicillin. After discussion with Dr. Gavin Potters and the pharmacist they have decided the patient could have ancef pre-op.

## 2014-11-05 NOTE — Care Management Note (Signed)
Case Management Note  Patient Details  Name: MARLEIGH KAYLOR MRN: [id number] Date of Birth: May 02, 1924  Subjective/Objective:                  Met with patient and her daughter to discuss discharge planning. Patient just received from PACU. She wants to go to [facility] for rehab. She has been to [facility] in the past and has used [facility] in the past. She uses [facility] for [facility] [phone]. She usually ambulates with a cane. She does not have a front-wheeled walker but has a rollator. She is from home with her son and grandson.  Action/Plan: I explained that SNF authorization would be based on patient progress and [employer]. I also explained that I would provide rolling walker to her and arrange home health if she is able to return home.   Expected Discharge Date:                  Expected Discharge Plan:     In-House Referral:  Clinical Social Work  Discharge planning Services  CM Consult  Post Acute Care Choice:    Choice offered to:  Patient, Adult Children  DME Arranged:    DME Agency:     HH Arranged:    [place] Agency:     Status of Service:     Medicare Important Message Given:    Date Medicare IM Given:    Medicare IM give by:    Date Additional Medicare IM Given:    Additional Medicare Important Message give by:     If discussed at [place] of Stay Meetings, dates discussed:    Additional Comments:  Marshell Garfinkel, RN 11/05/2014, 2:13 PM

## 2014-11-06 ENCOUNTER — Encounter
Admission: RE | Admit: 2014-11-06 | Discharge: 2014-11-06 | Disposition: A | Discharge status: Home / Self Care | Payer: Commercial Managed Care - HMO | Source: Ambulatory Visit | Attending: Internal Medicine | Admitting: Internal Medicine

## 2014-11-06 DIAGNOSIS — E871 Hypo-osmolality and hyponatremia: Secondary | ICD-10-CM | POA: Insufficient documentation

## 2014-11-06 LAB — CBC
HEMATOCRIT: 32.4 % — AB (ref 35.0–47.0)
HEMOGLOBIN: 11.3 g/dL — AB (ref 12.0–16.0)
MCH: 34.1 pg — ABNORMAL HIGH (ref 26.0–34.0)
MCHC: 34.8 g/dL (ref 32.0–36.0)
MCV: 98.1 fL (ref 80.0–100.0)
Platelets: 166 10*3/uL (ref 150–440)
RBC: 3.3 MIL/uL — ABNORMAL LOW (ref 3.80–5.20)
RDW: 13 % (ref 11.5–14.5)
WBC: 7.1 10*3/uL (ref 3.6–11.0)

## 2014-11-06 LAB — BASIC METABOLIC PANEL
ANION GAP: 7 (ref 5–15)
BUN: 9 mg/dL (ref 6–20)
CO2: 28 mmol/L (ref 22–32)
CREATININE: 0.54 mg/dL (ref 0.44–1.00)
Calcium: 8.3 mg/dL — ABNORMAL LOW (ref 8.9–10.3)
Chloride: 93 mmol/L — ABNORMAL LOW (ref 101–111)
GFR calc Af Amer: >60 mL/min (ref >60)
GLUCOSE: 138 mg/dL — AB (ref 65–99)
Potassium: 4.3 mmol/L (ref 3.5–5.1)
Sodium: 128 mmol/L — ABNORMAL LOW (ref 135–145)

## 2014-11-06 NOTE — Progress Notes (Signed)
  Subjective: 1 Day Post-Op Procedure(s) (LRB): TOTAL KNEE ARTHROPLASTY (Left) Patient reports pain as mild.   Patient is well, but has had some minor complaints of pain in her left leg. Plan is to go Skilled nursing facility after hospital stay. Negative for chest pain and shortness of breath Fever: no Gastrointestinal:Negative for nausea and vomiting.  She is very hungry this morning.  Objective: Vital signs in last 24 hours: Temp:  [97.3 F (36.3 C)-98 F (36.7 C)] 98 F (36.7 C) (06/23 0442) Pulse Rate:  [50-81] 65 (06/23 0442) Resp:  [9-20] 18 (06/23 0442) BP: (91-167)/(50-77) 145/55 mmHg (06/23 0442) SpO2:  [87 %-100 %] 100 % (06/23 0442) Weight:  [64.003 kg (141 lb 1.6 oz)] 64.003 kg (141 lb 1.6 oz) (06/22 1322)  Intake/Output from previous day:  Intake/Output Summary (Last 24 hours) at 11/06/14 0727 Last data filed at 11/06/14 0453  Gross per 24 hour  Intake 2415.5 ml  Output    950 ml  Net 1465.5 ml    Intake/Output this shift:    Labs:  Recent Labs  11/05/14 1351 11/06/14 0438  HGB 12.2 11.3*    Recent Labs  11/05/14 1351 11/06/14 0438  WBC 10.6 7.1  RBC 3.60* 3.30*  HCT 35.5 32.4*  PLT 200 PENDING    Recent Labs  11/05/14 1351 11/06/14 0438  NA  --  128*  K  --  4.3  CL  --  93*  CO2  --  28  BUN  --  9  CREATININE 0.66 0.54  GLUCOSE  --  138*  CALCIUM  --  8.3*   No results for input(s): LABPT, INR in the last 72 hours.   EXAM General - Patient is Alert, Appropriate and Oriented Extremity - Neurologically intact ABD soft Sensation intact distally Dorsiflexion/Plantar flexion intact Incision: dressing C/D/I  Right leg swollen.  Homan's sign negative. Dressing/Incision - clean, dry Motor Function - intact, moving foot and toes well on exam.  Past Medical History  Diagnosis Date  . Hypertension   . GERD (gastroesophageal reflux disease)   . Arthritis   . Environmental allergies   . Cough   . Atrial fibrillation   . COPD  (chronic obstructive pulmonary disease)     Assessment/Plan: 1 Day Post-Op Procedure(s) (LRB): TOTAL KNEE ARTHROPLASTY (Left) Active Problems:   S/P total knee replacement  Estimated body mass index is 28.48 kg/(m^2) as calculated from the following:   Height as of this encounter: 4\' 11"  (1.499 m).   Weight as of this encounter: 64.003 kg (141 lb 1.6 oz). Advance diet Up with therapy D/C IV fluids  Remove Foley today.  Na is low today (128) will DC IV fluids.  DVT Prophylaxis - Lovenox, Foot Pumps and TED hose Weight-Bearing as tolerated to Left leg  J. Horris Latino, PA-C [facility] 11/06/2014, 7:27 AM

## 2014-11-06 NOTE — Progress Notes (Signed)
Patient seen this evening. Patient seems reasonably comfortable. She did walk with physical therapy today. She was able to walk a short distance, primarily in her room. She has been using her Polar Care cooling pad and her TENS unit. IV has been saline locked. She will probably have her dressing changed tomorrow. She has been eating and taking po fluids fairly well. Her pain is being controlled with oral medication. Her hemoglobin was 11.3 today and her sodium was 128. She will probably be transferred to a skilled nursing facility on 11/08/2014. She will need to return to the orthopedic Department at [facility] in [place] about 10 days post discharge.

## 2014-11-06 NOTE — Evaluation (Signed)
Occupational Therapy Evaluation Patient Details Name: Natalie Schneider MRN: [id number] DOB: [birth year] Today's Date: 11/06/2014    History of Present Illness admitted for acute hospitalization status post L TKR (11/05/14), WBAT (per telephone clarification with Dr. Gavin Potters with PT   Clinical Impression   Patient is a 81 yo female who was diagnosed with degenerative osteoarthritis of the left knee and was admitted to [facility] and underwent surgery for a left knee replacement.  She lives at home with her son and grandson in a one story home with 3 steps to enter and a railing on both sides.  She has a walk in shower with a door and seat inside as well as grab bars.  She has a BSC.  Prior to admission she was independent with basic ADLs and required some assist with IADLs.  She now requires increased assistance with basic self-care tasks, transfers and functional mobility tasks and would benefit from skilled OT to maximize her safety and independence in daily tasks.  She will likely require short term rehab.    Follow Up Recommendations  SNF    Equipment Recommendations  Other (comment) (continue to assess)    Recommendations for Other Services       Precautions / Restrictions Precautions Precautions: Fall Restrictions Weight Bearing Restrictions: Yes LLE Weight Bearing: Weight bearing as tolerated      Mobility Bed Mobility Overal bed mobility: Needs Assistance Bed Mobility: Supine to Sit     Supine to sit: Mod assist        Transfers Overall transfer level: Needs assistance Equipment used: Rolling walker (2 wheeled) Transfers: Sit to/from Stand Sit to Stand: Mod assist         General transfer comment: cuing for hand placement; maintains L LE anterior to BOS    Balance Overall balance assessment: Needs assistance Sitting-balance support: No upper extremity supported;Feet supported Sitting balance-Leahy Scale: Good     Standing balance support: Bilateral upper  extremity supported Standing balance-Leahy Scale: Fair                              ADL Overall ADL's : Needs assistance/impaired Eating/Feeding: Set up   Grooming: Set up;Modified independent   Upper Body Bathing: Moderate assistance;Set up   Lower Body Bathing: Set up;Moderate assistance   Upper Body Dressing : Set up;Minimal assistance   Lower Body Dressing: Set up;Moderate assistance   Toilet Transfer: Set up;Moderate assistance   Toileting- Clothing Manipulation and Hygiene: Set up;Moderate assistance               Vision     Perception     Praxis      Pertinent Vitals/Pain Pain Assessment: 0-10 Pain Score: 7  Pain Location: left knee Pain Descriptors / Indicators: Aching Pain Intervention(s): Limited activity within patient's tolerance;Monitored during session;Premedicated before session     Hand Dominance     Extremity/Trunk Assessment Upper Extremity Assessment Upper Extremity Assessment: Overall WFL for tasks assessed   Lower Extremity Assessment Lower Extremity Assessment: Defer to PT evaluation LLE Deficits / Details: L knee ROM 12-64 degrees; very guarded due to pain. Strength at least 3-/5.  Neurovascularly intact and otherwise grossly WFL.       Communication Communication Communication: No difficulties   Cognition Arousal/Alertness: Lethargic;Suspect due to medications Behavior During Therapy: [facility] for tasks assessed/performed Overall Cognitive Status: Difficult to assess       Memory:  (appeared to have a hard time  processing)             General Comments       Exercises   Other Exercises Other Exercises: Supine L LE therex, 1x10, act assist ROM for muscular strength/flexibility: ankle pumps, quad sets, SAQs, heel slides, hip abduct/adduct, SLR.  Requires assist from therapist to complete all knee mobility; very guarded due to pain. (10 minutes)   Shoulder Instructions      Home Living Family/patient expects  to be discharged to:: Skilled nursing facility Living Arrangements: Children;Other relatives Available Help at Discharge: Family Type of Home: House Home Access: Stairs to enter [employer] of Steps: 3 Entrance Stairs-Rails: Right Home Layout: One level               Home Equipment: Walker - 2 wheels;Cane - single point          Prior Functioning/Environment Level of Independence: Independent with assistive device(s)        Comments: Indep with household/community activities with [facility]; enjoys working outside in garden.  Denies fall history.    OT Diagnosis: Generalized weakness;Acute pain;Other (comment) (decreased ADL status)   OT Problem List: Decreased strength;Pain;Decreased range of motion;Decreased activity tolerance;Decreased knowledge of use of DME or AE   OT Treatment/Interventions: Self-care/ADL training;Therapeutic exercise;Patient/family education;DME and/or AE instruction    OT Goals(Current goals can be found in the care plan section) Acute Rehab OT Goals Patient Stated Goal: "to get better to be able to take care of myself"  Patient reports she plans to go for rehab prior to returning home. OT Goal Formulation: With patient Time For Goal Achievement: 11/13/14 Potential to Achieve Goals: Good ADL Goals Pt Will Perform Lower Body Bathing: with min assist;with adaptive equipment Pt Will Perform Lower Body Dressing: with min assist;with adaptive equipment Pt Will Transfer to Toilet: with min assist  OT Frequency: Min 1X/week   Barriers to D/C:            Co-evaluation              End of Session    Activity Tolerance: Patient limited by lethargy;Patient limited by pain Patient left: in chair   Time: 0127-0156 OT Time Calculation (min): 29 min Charges:  OT General Charges $OT Visit: 1 Procedure OT Evaluation $Initial OT Evaluation Tier I: 1 Procedure G-Codes:    Lovett,Amy 28-Nov-2014, 2:01 PM

## 2014-11-06 NOTE — Clinical Social Work Placement (Signed)
   CLINICAL SOCIAL WORK PLACEMENT  NOTE  Date:  11/06/2014  Patient Details  Name: Natalie Schneider MRN: [id number] Date of Birth: 07/04/1923  Clinical Social Work is seeking post-discharge placement for this patient at the Skilled  Nursing Facility level of care (*CSW will initial, date and re-position this form in  chart as items are completed):  Yes   Patient/family provided with [facility] Clinical Social Work Department's list of facilities offering this level of care within the geographic area requested by the patient (or if unable, by the patient's family).  Yes   Patient/family informed of their freedom to choose among providers that offer the needed level of care, that participate in Medicare, Medicaid or managed care program needed by the patient, have an available bed and are willing to accept the patient.  Yes   Patient/family informed of 's ownership interest in [facility] and [facility], as well as of the fact that they are under no obligation to receive care at these facilities.  PASRR submitted to EDS on 11/05/14     PASRR number received on 11/05/14     Existing PASRR number confirmed on       FL2 transmitted to all facilities in geographic area requested by pt/family on 11/05/14     FL2 transmitted to all facilities within larger geographic area on       Patient informed that his/her managed care company has contracts with or will negotiate with certain facilities, including the following:        Yes   Patient/family informed of bed offers received.  Patient chooses bed at  [facility] )     Physician recommends and patient chooses bed at      Patient to be transferred to   on  .  Patient to be transferred to facility by       Patient family notified on   of transfer.  Name of family member notified:        PHYSICIAN       Additional Comment:    _______________________________________________ Haig Prophet,  LCSW 11/06/2014, 1:34 PM

## 2014-11-06 NOTE — Progress Notes (Signed)
Patient's up an ambulating in room with physical therapist. 2 person assist.

## 2014-11-06 NOTE — Evaluation (Signed)
Physical Therapy Evaluation Patient Details Name: Natalie ERKKILA MRN: [id number] DOB: [birth year] Today's Date: 11/06/2014   History of Present Illness  admitted for acute hospitalization status post L TKR (11/05/14), WBAT (per telephone clarification with Dr. Gavin Potters).  Clinical Impression  Upon evaluation, patient alert and oriented, follows all commands.  Demonstrates fair strength/ROM to L LE (12-63 degrees, strength at least 3-/5), but very guarded due to pain. Currently requiring mod assist from therapist for supine/sit; min/mod assist with RW for sit/stand and short-distance gait from bed/chair.  Very limited weight acceptance to L LE with mobility; very brief stance time, but no overt buckling. Unable to tolerate activity beyond bed/chair due to pain.  RN informed/aware; to administer pain medication as appropriate. Would benefit from skilled PT to address above deficits and promote optimal return to PLOF; recommend transition to STR upon discharge from acute hospitalization. Patient aware of recommendations and in agreement with plan.    Follow Up Recommendations SNF    Equipment Recommendations       Recommendations for Other Services       Precautions / Restrictions Precautions Precautions: Fall Restrictions Weight Bearing Restrictions: Yes LLE Weight Bearing: Weight bearing as tolerated      Mobility  Bed Mobility Overal bed mobility: Needs Assistance Bed Mobility: Supine to Sit     Supine to sit: Mod assist        Transfers Overall transfer level: Needs assistance Equipment used: Rolling walker (2 wheeled) Transfers: Sit to/from Stand Sit to Stand: Min assist;Mod assist         General transfer comment: cuing for hand placement; maintains L LE anterior to BOS  Ambulation/Gait Ambulation/Gait assistance: Min assist;Mod assist Ambulation Distance (Feet): 4 Feet Assistive device: Rolling walker (2 wheeled)       General Gait Details: bed/chair with  RW; 3 point, step to gait pattern with very limited weight acceptance/stance time to L LE.  Very slow and effortful; unable to tolerate additional distance due to fatigue.  Stairs            Wheelchair Mobility    Modified Rankin (Stroke Patients Only)       Balance Overall balance assessment: Needs assistance Sitting-balance support: No upper extremity supported;Feet supported Sitting balance-Leahy Scale: Good     Standing balance support: Bilateral upper extremity supported Standing balance-Leahy Scale: Fair                               Pertinent Vitals/Pain Pain Assessment: 0-10 Pain Score: 7  Pain Location: L knee Pain Descriptors / Indicators: Aching Pain Intervention(s): Limited activity within patient's tolerance;Monitored during session;Repositioned;Patient requesting pain meds-RN notified    Home Living Family/patient expects to be discharged to:: Skilled nursing facility Living Arrangements: Children;Other relatives (Son, grandson) Available Help at Discharge: Family Type of Home: House Home Access: Stairs to enter Entrance Stairs-Rails: Right Entrance Stairs-Number of Steps: 3 Home Layout: One level Home Equipment: Environmental consultant - 2 wheels;Cane - single point      Prior Function Level of Independence: Independent with assistive device(s)         Comments: Indep with household/community activities with [facility]; enjoys working outside in garden.  Denies fall history.     Hand Dominance        Extremity/Trunk Assessment   Upper Extremity Assessment: Overall WFL for tasks assessed           Lower Extremity Assessment: LLE deficits/detail   LLE  Deficits / Details: L knee ROM 12-64 degrees; very guarded due to pain. Strength at least 3-/5.  Neurovascularly intact and otherwise grossly WFL.     Communication   Communication: No difficulties  Cognition Arousal/Alertness: Awake/alert Behavior During Therapy: WFL for tasks  assessed/performed Overall Cognitive Status: Within Functional Limits for tasks assessed                      General Comments General comments (skin integrity, edema, etc.): L LE surgical dressing, TENS, polar care intact    Exercises Other Exercises Other Exercises: Supine L LE therex, 1x10, act assist ROM for muscular strength/flexibility: ankle pumps, quad sets, SAQs, heel slides, hip abduct/adduct, SLR.  Requires assist from therapist to complete all knee mobility; very guarded due to pain. (10 minutes)      Assessment/Plan    PT Assessment Patient needs continued PT services  PT Diagnosis Difficulty walking;Generalized weakness;Acute pain   PT Problem List Decreased strength;Decreased range of motion;Decreased activity tolerance;Decreased balance;Decreased knowledge of precautions;Decreased mobility;Decreased knowledge of use of DME;Decreased safety awareness;Decreased coordination;Pain  PT Treatment Interventions DME instruction;Gait training;Stair training;Functional mobility training;Therapeutic activities;Therapeutic exercise;Balance training;Patient/family education   PT Goals (Current goals can be found in the Care Plan section) Acute Rehab PT Goals Patient Stated Goal: "to go to rehab" PT Goal Formulation: With patient Time For Goal Achievement: 11/20/14 Potential to Achieve Goals: Good    Frequency BID   Barriers to discharge        Co-evaluation               End of Session Equipment Utilized During Treatment: Gait belt Activity Tolerance: Patient limited by pain Patient left: in chair;with call bell/phone within reach;with chair alarm set Nurse Communication: Mobility status         Time: 1040-1111 PT Time Calculation (min) (ACUTE ONLY): 31 min   Charges:   PT Evaluation $Initial PT Evaluation Tier I: 1 Procedure PT Treatments $Therapeutic Exercise: 8-22 mins   PT G Codes:        Jaelen Gellerman H. Manson Passey, PT, DPT, NCS 11/06/2014, 11:29  AM [phone]

## 2014-11-06 NOTE — Plan of Care (Signed)
Problem: Consults Goal: Diagnosis- Total Joint Replacement Primary Total Knee     

## 2014-11-06 NOTE — Clinical Social Work Note (Signed)
Clinical Social Work Assessment  Patient Details  Name: Natalie Schneider MRN: [id number] Date of Birth: 05/24/1923  Date of referral:  11/06/14               Reason for consult:  Facility Placement                Permission sought to share information with:  Chartered certified accountant granted to share information::  Yes, Verbal Permission Granted  Name::      [place]::   [place]  Relationship::     Contact Information:     Housing/Transportation Living arrangements for the past 2 months:  [place] of Information:  Patient Patient Interpreter Needed:  None Criminal Activity/Legal Involvement Pertinent to Current Situation/Hospitalization:  No - Comment as needed Significant Relationships:  Adult Children Lives with:  Adult Children Do you feel safe going back to the place where you live?  Yes Need for family participation in patient care:  No (Coment)  Care giving concerns: Patient lives in [place] with her son Jeneen Rinks.    Social Worker assessment / plan: [place] representative (CSW) received SNF consult. PT is recommending SNF. CSW met with patient to address consult. Patient was sitting up in the chair and just finished up with PT. CSW introduced self and explained role of CSW department. Patient was alert and oriented. Patient reported that she was in pain from just working with PT. Patient reported that she lives in [place] with her son Jeneen Rinks and grandson. Patient reported that her HPOA is her friend Darnelle Bos, who lives in [place]. CSW explained SNF process. Patient is agreeable to SNF search in [facility] and prefers [facility]   [facility] made a bed offer. Patient accepted bed offer. CSW contacted Amy with [facility] and made her aware of above. Plan is for patient to D/C to [facility] Saturday. Per Amy she will give [facility] authorization tomorrow. CSW will continue to follow and assist as needed.    Employment status:  Retired Nurse, adult PT Recommendations:  [place] / Referral to community resources:  [place]  Patient/Family's Response to care: Patient is agreeable to going to [facility] for rehab Saturday.   Patient/Family's Understanding of and Emotional Response to Diagnosis, Current Treatment, and Prognosis:  Patient was pleasant and thanked CSW for visit.   Emotional Assessment Appearance:  Appears stated age Attitude/Demeanor/Rapport:  Other (Patient complained of a lot of pain. ) Affect (typically observed):  Calm, Pleasant, Quiet Orientation:  Oriented to Self, Oriented to Place, Oriented to  Time, Oriented to Situation Alcohol / Substance use:  Not Applicable Psych involvement (Current and /or in the community):  No (Comment)  Discharge Needs  Concerns to be addressed:  Discharge Planning Concerns Readmission within the last 30 days:  No Current discharge risk:  None Barriers to Discharge:  Continued Medical Work up   Loralyn Freshwater, LCSW 11/06/2014, 1:36 PM

## 2014-11-06 NOTE — Progress Notes (Signed)
Post-op day1 - s/p left total knee, up with therapy x 2, ambulated in room with therapy this afternoon, IV fluids stop- tolerating po well, had one episode of nausea- given zofran x 1- with relief. Oral pain medication oxycodone given as needed for left knee pain (see MAR), given miralax for constipation- no bowel movement since adm. Encouraged to deep breath and cough. Weaned of oxygen this am. Left knee- post-op dressing dry/clean/intact- with tens unit and polar care. Heels elevated of the bed.  Pending discharge to SNF.

## 2014-11-06 NOTE — Progress Notes (Signed)
Physical Therapy Treatment Patient Details Name: Natalie Schneider MRN: [id number] DOB: [birth year] Today's Date: 11/06/2014    History of Present Illness admitted for acute hospitalization status post L TKR (11/05/14), WBAT (per telephone clarification with Dr. Gavin Potters with PT    PT Comments    Pt demonstrates improved transfers and ambulation this afternoon. Increased weight acceptance to LLE during gait and increased RLE step length. Pt is still limited by high levels of pain and fatigue. She is only able to tolerate limited seated exercises with rest breaks between due to nausea and pain. Pt will benefit from skilled PT services to address deficits in strength, balance, and mobility in order to return to full function at home.    Follow Up Recommendations  SNF     Equipment Recommendations       Recommendations for Other Services       Precautions / Restrictions Precautions Precautions: Fall Restrictions Weight Bearing Restrictions: Yes LLE Weight Bearing: Weight bearing as tolerated    Mobility  Bed Mobility Overal bed mobility: Needs Assistance Bed Mobility: Sit to Supine     Supine to sit: Mod assist Sit to supine: Mod assist   General bed mobility comments: L hip flexion weakness and pain limiting transfers. Cues provided for ease of transfer  Transfers Overall transfer level: Needs assistance Equipment used: Rolling walker (2 wheeled) Transfers: Sit to/from Stand Sit to Stand: Min assist         General transfer comment: Cues for hand placement during both phases of transfer. Poor awareness and HOH. Performed standing balance with patient when changing diaper after transfer to [facility]. Practiced transfer to [facility] with cues for safe hand placement, transfers, and standing balance  Ambulation/Gait Ambulation/Gait assistance: Min assist;+2 safety/equipment Ambulation Distance (Feet): 20 Feet Assistive device: Rolling walker (2 wheeled)     Gait velocity  interpretation: <1.8 ft/sec, indicative of risk for recurrent falls General Gait Details:  RW; 3 point, step to gait pattern with very limited weight acceptance/stance time to L LE. Improved from this morning. Very slow and effortful; Speed and RLE step length slowly improve throughout session. Pt is limited due to pain and fatigue   Stairs            Wheelchair Mobility    Modified Rankin (Stroke Patients Only)       Balance Overall balance assessment: Needs assistance Sitting-balance support: No upper extremity supported;Feet supported Sitting balance-Leahy Scale: Good     Standing balance support: Bilateral upper extremity supported Standing balance-Leahy Scale: Fair                      Cognition Arousal/Alertness: Lethargic;Suspect due to medications Behavior During Therapy: [facility] for tasks assessed/performed Overall Cognitive Status: Difficult to assess (Appears baseline)       Memory:  (appeared to have a hard time processing)              Exercises Total Joint Exercises Ankle Circles/Pumps: Strengthening;Both;10 reps;Seated Towel Squeeze: Strengthening;Both;Seated;10 reps Heel Slides: Strengthening;5 reps;Both;Seated Hip ABduction/ADduction: Strengthening;Both;5 reps;Seated Long Arc Quad: Strengthening;Both;5 reps;Seated Marching in Standing: Strengthening;Both;10 reps;Seated Other Exercises Other Exercises: Supine L LE therex, 1x10, act assist ROM for muscular strength/flexibility: ankle pumps, quad sets, SAQs, heel slides, hip abduct/adduct, SLR.  Requires assist from therapist to complete all knee mobility; very guarded due to pain. (10 minutes)    General Comments General comments (skin integrity, edema, etc.): L LE surgical dressing, TENS, polar care intact  Pertinent Vitals/Pain Pain Assessment: 0-10 Pain Score: 8  Pain Location: L knee Pain Descriptors / Indicators: Aching Pain Intervention(s): Premedicated before session     Home Living Family/patient expects to be discharged to:: Skilled nursing facility Living Arrangements: Children;Other relatives Available Help at Discharge: Family Type of Home: House Home Access: Stairs to enter Entrance Stairs-Rails: Right Home Layout: One level Home Equipment: Environmental consultant - 2 wheels;Cane - single point      Prior Function Level of Independence: Independent with assistive device(s)      Comments: Indep with household/community activities with [facility]; enjoys working outside in garden.  Denies fall history.   PT Goals (current goals can now be found in the care plan section) Acute Rehab PT Goals Patient Stated Goal: "to get better to be able to take care of myself"  Patient reports she plans to go for rehab prior to returning home. PT Goal Formulation: With patient Time For Goal Achievement: 11/20/14 Potential to Achieve Goals: Good Progress towards PT goals: Progressing toward goals    Frequency  BID    PT Plan Current plan remains appropriate    Co-evaluation             End of Session Equipment Utilized During Treatment: Gait belt Activity Tolerance: Patient limited by pain Patient left: with call bell/phone within reach;in bed;with bed alarm set;with SCD's reapplied (towel roll and polar care in place)     Time: 1419-1500 PT Time Calculation (min) (ACUTE ONLY): 41 min  Charges:  $Gait Training: 8-22 mins $Therapeutic Exercise: 8-22 mins $Therapeutic Activity: 8-22 mins                    G Codes:      Sharalyn Ink Mayar Whittier PT, DPT   Shatia Sindoni 11/06/2014, 3:10 PM

## 2014-11-07 LAB — CBC
HCT: 28.9 % — ABNORMAL LOW (ref 35.0–47.0)
Hemoglobin: 10.6 g/dL — ABNORMAL LOW (ref 12.0–16.0)
MCH: 34.9 pg — ABNORMAL HIGH (ref 26.0–34.0)
MCHC: 36.7 g/dL — ABNORMAL HIGH (ref 32.0–36.0)
MCV: 95.1 fL (ref 80.0–100.0)
PLATELETS: 166 10*3/uL (ref 150–440)
RBC: 3.04 MIL/uL — ABNORMAL LOW (ref 3.80–5.20)
RDW: 12.6 % (ref 11.5–14.5)
WBC: 7.3 10*3/uL (ref 3.6–11.0)

## 2014-11-07 LAB — BASIC METABOLIC PANEL
Anion gap: 6 (ref 5–15)
BUN: 9 mg/dL (ref 6–20)
CHLORIDE: 87 mmol/L — AB (ref 101–111)
CO2: 30 mmol/L (ref 22–32)
Calcium: 8 mg/dL — ABNORMAL LOW (ref 8.9–10.3)
Creatinine, Ser: 0.58 mg/dL (ref 0.44–1.00)
GFR calc non Af Amer: >60 mL/min (ref >60)
Glucose, Bld: 119 mg/dL — ABNORMAL HIGH (ref 65–99)
POTASSIUM: 4 mmol/L (ref 3.5–5.1)
Sodium: 123 mmol/L — ABNORMAL LOW (ref 135–145)

## 2014-11-07 MED ORDER — ENOXAPARIN SODIUM 40 MG/0.4ML ~~LOC~~ SOLN: 40.0000 mg | SUBCUTANEOUS | Status: DC

## 2014-11-07 MED ORDER — OXYCODONE HCL 5 MG PO TABS: 5.0000 mg | ORAL_TABLET | ORAL | Status: DC | PRN

## 2014-11-07 NOTE — Progress Notes (Signed)
Plan is for patient to D/C to [facility] tomorrow (Saturday 11/08/14). Per Kim admissions coordinator at [facility] patient is going to room 201-B. RN will call report at [phone]. [facility] authorization has been received. Auth # [id number]. Clinical Child psychotherapist (CSW) sent D/C Summary to [employer] via carefinder today. CSW faxed signed [facility] consent form to Selena Batten and gave a copy to patient. Patient and her daughter Erskine Squibb are aware of above. Daughter Erskine Squibb will provide transport Saturday. CSW will continue to follow and assist as needed.   Jetta Lout, LCSWA [phone]

## 2014-11-07 NOTE — Discharge Summary (Signed)
Physician Discharge Summary  Patient ID: Natalie Schneider MRN: [id number] DOB/AGE: 17-Oct-1923 81 y.o.  Admit date: 11/05/2014 Discharge date: 11/07/2014  Admission Diagnoses:  DEGENERATIVE OSTEOARTHRITIS   Discharge Diagnoses: Patient Active Problem List   Diagnosis Date Noted  . S/P total knee replacement 11/05/2014    Past Medical History  Diagnosis Date  . Hypertension   . GERD (gastroesophageal reflux disease)   . Arthritis   . Environmental allergies   . Cough   . Atrial fibrillation   . COPD (chronic obstructive pulmonary disease)      Transfusion: None   Consultants (if any): None  Discharged Condition: Improved  Hospital Course: Natalie Schneider is an 81 y.o. female who was admitted 11/05/2014 with a diagnosis of degenerative arthrosis of the left knee and went to the operating room on 11/05/2014 and underwent left knee total arthroplasty.    Surgeries: Procedure(s): TOTAL KNEE ARTHROPLASTY on 11/05/2014 Patient tolerated the surgery well. Taken to PACU where she was stabilized and then transferred to the orthopedic floor.  Started on Lovenox  q 12 hrs. Foot pumps applied bilaterally at 80 mm. Heels elevated on bed with rolled towels. No evidence of DVT. Negative Homan. Physical therapy started on day #1 for gait training and transfer. OT started day #1 for ADL and assisted devices.  Patient's IV , foley were d/c on POD1  Pt found to have hyponatremia on POD1. Na trending down to 123 on POD2. Patient was placed on fluid restriction diet. HCTZ was held. On POD3 Na had improved to 129. On POD3 patient was doing well. No complaints. VSS. Stable and ready for discharge to rehab.  Implants: Stryker triathlon #3 posterior stabilized femoral component, #3 universal tibial baseplate, #3 nine millimeter tibial insert, A29 mm by 9mm asymmetric patella  She was given perioperative antibiotics:  Anti-infectives    Start     Dose/Rate Route Frequency Ordered Stop   11/05/14 1400  ceFAZolin (ANCEF) IVPB 1 g/50 mL premix     1 g 100 mL/hr over 30 Minutes Intravenous 3 times per day 11/05/14 1256 11/06/14 0530   11/05/14 0726  ceFAZolin (ANCEF) 1-5 GM-% IVPB    Comments:  Leilani Able: cabinet override      11/05/14 0726 11/05/14 1929   11/05/14 0725  ceFAZolin (ANCEF) 1-5 GM-% IVPB    Comments:  Leilani Able: cabinet override      11/05/14 0725 11/05/14 1929   11/05/14 0708  ceFAZolin (ANCEF) 1-5 GM-% IVPB    Comments:  Leilani Able: cabinet override      11/05/14 0708 11/05/14 0757    .  She was given sequential compression devices, early ambulation, and Lovenox for DVT prophylaxis.  She benefited maximally from the hospital stay and there were no complications.    Recent vital signs:  Filed Vitals:   11/07/14 0743  BP: 136/48  Pulse: 74  Temp: 97.4 F (36.3 C)  Resp: 18    Recent laboratory studies:  Lab Results  Component Value Date   HGB 10.6* 11/07/2014   HGB 11.3* 11/06/2014   HGB 12.2 11/05/2014   Lab Results  Component Value Date   WBC 7.3 11/07/2014   PLT 166 11/07/2014   Lab Results  Component Value Date   INR 1.01 10/23/2014   Lab Results  Component Value Date   NA 123* 11/07/2014   K 4.0 11/07/2014   CL 87* 11/07/2014   CO2 30 11/07/2014   BUN 9 11/07/2014   CREATININE 0.58 11/07/2014  GLUCOSE 119* 11/07/2014    Discharge Medications:     Medication List    TAKE these medications        albuterol 108 (90 BASE) MCG/ACT inhaler  Commonly known as:  PROVENTIL HFA;VENTOLIN HFA  Inhale 2 puffs into the lungs every 6 (six) hours as needed for wheezing.     ALPRAZolam 0.25 MG tablet  Commonly known as:  XANAX  Take 0.25 mg by mouth 2 (two) times daily as needed for anxiety.     benzonatate 100 MG capsule  Commonly known as:  TESSALON  Take by mouth 3 (three) times daily as needed for cough.     busPIRone 5 MG tablet  Commonly known as:  BUSPAR  Take 5 mg by mouth 2 (two) times daily.      clotrimazole-betamethasone cream  Commonly known as:  LOTRISONE  Apply 1 application topically 2 (two) times daily.     diclofenac sodium 1 % Gel  Commonly known as:  VOLTAREN  Apply 4 g topically 4 (four) times daily.     digoxin 0.25 MG tablet  Commonly known as:  LANOXIN  Take 0.25 mg by mouth daily.     diphenhydrAMINE 25 MG tablet  Commonly known as:  BENADRYL  Take 25 mg by mouth every 6 (six) hours as needed.     enoxaparin 40 MG/0.4ML injection  Commonly known as:  LOVENOX  Inject 0.4 mLs (40 mg total) into the skin daily.     HYDROcodone-homatropine 5-1.5 MG/5ML syrup  Commonly known as:  HYCODAN  Take 5 mLs by mouth every 6 (six) hours as needed for cough.     loperamide 2 MG capsule  Commonly known as:  IMODIUM  Take by mouth as needed for diarrhea or loose stools.     losartan-hydrochlorothiazide 100-12.5 MG per tablet  Commonly known as:  HYZAAR  Take 1 tablet by mouth daily.     metoprolol succinate 50 MG 24 hr tablet  Commonly known as:  TOPROL-XL  Take 50 mg by mouth daily. Take with or immediately following a meal.     oxyCODONE 5 MG immediate release tablet  Commonly known as:  Oxy IR/ROXICODONE  Take 1-2 tablets (5-10 mg total) by mouth every 4 (four) hours as needed for breakthrough pain.     pantoprazole 40 MG tablet  Commonly known as:  PROTONIX  Take 40 mg by mouth daily.     raloxifene 60 MG tablet  Commonly known as:  EVISTA  Take 60 mg by mouth daily.        Diagnostic Studies: X-ray Knee Left Ap And Lateral  11/05/2014   CLINICAL DATA:  Status post left knee replacement  EXAM: LEFT KNEE - 1-2 VIEW  COMPARISON:  None.  FINDINGS: A left knee replacement is seen. Air is noted within the operative bed. No acute bony or soft tissue abnormality is seen.  IMPRESSION: Status post left knee replacement.  No acute abnormality is noted.   Electronically Signed   By: Alcide Clever M.D.   On: 11/05/2014 12:59    Disposition: Pt has improved steadily  each day while admited to the hospital.  She is stable and ready for d/c to skilled nursing facility on POD3.        Follow-up Information    Follow up with Randon Goldsmith, MD In 10 days.   Specialty:  Unknown Physician Specialty   Why:  For wound re-check and staple removal   Contact information:  [address] [place] [address] [phone]        Signed: Meriel Pica PA-C 11/07/2014, 10:30 AM

## 2014-11-07 NOTE — Progress Notes (Signed)
Occupational Therapy Treatment Patient Details Name: Natalie Schneider MRN: [id number] DOB: 05/19/1923 Today's Date: 11/07/2014    History of present illness L TKA 6/22   OT comments  Patient reports she is willing to try some adaptive equipment today to help with getting dressed.  She was limited by pain and limited hand strength for use of sock aid to don socks.  Instructed on reacher to doff socks and use for underwear.  Patient will need additional instruction on use and may require assist secondary to decreased hand strength with adaptive equipment until she is able to reach and perform task.  She would continue to benefit from skilled OT to increase her independence in daily tasks.   Follow Up Recommendations  SNF    Equipment Recommendations       Recommendations for Other Services      Precautions / Restrictions Precautions Precautions: Fall Required Braces or Orthoses: Knee Immobilizer - Left Knee Immobilizer - Left: On when out of bed or walking Restrictions Weight Bearing Restrictions: Yes LLE Weight Bearing: Weight bearing as tolerated       Mobility Bed Mobility Overal bed mobility: Needs Assistance Bed Mobility: Supine to Sit     Supine to sit: Mod assist        Transfers Overall transfer level: Needs assistance Equipment used: Rolling walker (2 wheeled) Transfers: Sit to/from Stand Sit to Stand: Min assist              Balance                                   ADL Overall ADL's : Needs assistance/impaired                     Lower Body Dressing: Moderate assistance;With adaptive equipment                        Vision                     Perception     Praxis      Cognition   Behavior During Therapy: [facility] for tasks assessed/performed Overall Cognitive Status: Within Functional Limits for tasks assessed                       Extremity/Trunk Assessment               Exercises  Total Joint Exercises Ankle Circles/Pumps: AROM;Strengthening;10 reps Quad Sets: AROM;Strengthening;10 reps Heel Slides: PROM;AAROM;10 reps Hip ABduction/ADduction: AROM;10 reps Straight Leg Raises: AAROM;Strengthening;10 reps Goniometric ROM: 2-82   Shoulder Instructions       General Comments      Pertinent Vitals/ Pain       Pain Assessment: 0-10 Pain Score: 5  Pain Location: left knee Pain Descriptors / Indicators: Aching;Tender Pain Intervention(s): Limited activity within patient's tolerance;Premedicated before session;Repositioned  Home Living                                          Prior Functioning/Environment              Frequency Min 1X/week     Progress Toward Goals  OT Goals(current goals can now be found in the care plan section)  Plan Discharge plan remains appropriate    Co-evaluation                 End of Session Equipment Utilized During Treatment: Gait belt;Rolling walker   Activity Tolerance Patient limited by pain   Patient Left in chair;with family/visitor present   Nurse Communication          Time: 0539-7673 OT Time Calculation (min): 24 min  Charges: OT General Charges $OT Visit: 1 Procedure OT Treatments $Self Care/Home Management : 23-37 mins  Devan Danzer 11/07/2014, 3:04 PM

## 2014-11-07 NOTE — Progress Notes (Signed)
Physical Therapy Treatment Patient Details Name: Natalie Schneider MRN: [id number] DOB: 01-Sep-1923 Today's Date: 11/07/2014    History of Present Illness L TKA 6/22    PT Comments    Pt continues to have pain and fatigue limitations, but is able to show some increase with ambulation and ROM today. She is weak with SLR and general L LE exercises.   Follow Up Recommendations  SNF     Equipment Recommendations       Recommendations for Other Services       Precautions / Restrictions Precautions Precautions: Fall Required Braces or Orthoses: Knee Immobilizer - Left Knee Immobilizer - Left: On when out of bed or walking Restrictions Weight Bearing Restrictions: Yes LLE Weight Bearing: Weight bearing as tolerated    Mobility  Bed Mobility Overal bed mobility: Needs Assistance Bed Mobility: Supine to Sit;Sit to Supine     Supine to sit: Mod assist Sit to supine: Min assist      Transfers Overall transfer level: Needs assistance Equipment used: Rolling walker (2 wheeled) Transfers: Sit to/from Stand Sit to Stand: Min assist            Ambulation/Gait Ambulation/Gait assistance: Min assist Ambulation Distance (Feet): 35 Feet Assistive device: Rolling walker (2 wheeled)       General Gait Details: pt again very slow, cautious, KI donned, pt fatigued with the effort, but ultimately is able to increase distance/tolerance   Stairs            Wheelchair Mobility    Modified Rankin (Stroke Patients Only)       Balance                                    Cognition Arousal/Alertness: Awake/alert Behavior During Therapy: WFL for tasks assessed/performed Overall Cognitive Status: Within Functional Limits for tasks assessed                      Exercises Total Joint Exercises Ankle Circles/Pumps: AROM;Strengthening;10 reps Quad Sets: AROM;Strengthening;10 reps Heel Slides: PROM;AAROM;10 reps Hip ABduction/ADduction: AROM;10  reps Straight Leg Raises: AAROM;Strengthening;10 reps Goniometric ROM: 2-82    General Comments        Pertinent Vitals/Pain Pain Assessment: 0-10 Pain Score: 6  Pain Location: left knee Pain Descriptors / Indicators: Aching;Tender Pain Intervention(s): Limited activity within patient's tolerance;Premedicated before session;Repositioned    Home Living                      Prior Function            PT Goals (current goals can now be found in the care plan section) Progress towards PT goals: Progressing toward goals    Frequency  BID    PT Plan Current plan remains appropriate    Co-evaluation             End of Session Equipment Utilized During Treatment: Gait belt Activity Tolerance: Patient tolerated treatment well Patient left: with bed alarm set     Time: 1308-6578 PT Time Calculation (min) (ACUTE ONLY): 28 min  Charges:  $Gait Training: 8-22 mins $Therapeutic Exercise: 8-22 mins                    G Codes:     Loran Senters, PT, DPT [phone]  Malachi Pro 11/07/2014, 5:18 PM

## 2014-11-07 NOTE — Progress Notes (Signed)
  Subjective: 2 Days Post-Op Procedure(s) (LRB): TOTAL KNEE ARTHROPLASTY (Left) Patient reports pain as mild.   Patient is well, but has had some minor complaints of nausea. Plan is to go Skilled nursing facility after hospital stay. Negative for chest pain and shortness of breath Fever: no Gastrointestinal:Positive for nausea but not vomiting  Objective: Vital signs in last 24 hours: Temp:  [97.3 F (36.3 C)-98.7 F (37.1 C)] 97.3 F (36.3 C) (06/24 0022) Pulse Rate:  [69-75] 75 (06/24 0022) Resp:  [18] 18 (06/24 0022) BP: (117-150)/(50-61) 138/50 mmHg (06/24 0022) SpO2:  [91 %-98 %] 91 % (06/24 0022)  Intake/Output from previous day:  Intake/Output Summary (Last 24 hours) at 11/07/14 0736 Last data filed at 11/07/14 0732  Gross per 24 hour  Intake    600 ml  Output   1025 ml  Net   -425 ml    Intake/Output this shift:    Labs:  Recent Labs  11/05/14 1351 11/06/14 0438 11/07/14 0446  HGB 12.2 11.3* 10.6*    Recent Labs  11/06/14 0438 11/07/14 0446  WBC 7.1 7.3  RBC 3.30* 3.04*  HCT 32.4* 28.9*  PLT 166 166    Recent Labs  11/05/14 1351 11/06/14 0438  NA  --  128*  K  --  4.3  CL  --  93*  CO2  --  28  BUN  --  9  CREATININE 0.66 0.54  GLUCOSE  --  138*  CALCIUM  --  8.3*   No results for input(s): LABPT, INR in the last 72 hours.   EXAM General - Patient is Alert, Appropriate and Oriented Extremity - Neurologically intact ABD soft Dorsiflexion/Plantar flexion intact Incision: no drainage Dressing/Incision - clean, healing Dressing changed today. Motor Function - intact, moving foot and toes well on exam.   Past Medical History  Diagnosis Date  . Hypertension   . GERD (gastroesophageal reflux disease)   . Arthritis   . Environmental allergies   . Cough   . Atrial fibrillation   . COPD (chronic obstructive pulmonary disease)     Assessment/Plan: 2 Days Post-Op Procedure(s) (LRB): TOTAL KNEE ARTHROPLASTY (Left) Active  Problems:   S/P total knee replacement  Estimated body mass index is 28.48 kg/(m^2) as calculated from the following:   Height as of this encounter: 4\' 11"  (1.499 m).   Weight as of this encounter: 64.003 kg (141 lb 1.6 oz). Advance diet Up with therapy Plan for discharge tomorrow to SNF. Patient still needs to have a BM. Pt at 91% O2 on room air. Encouraged incentive spirometer. Labs reviewed, will order BMP for today to check Na.   DVT Prophylaxis - Lovenox, Foot Pumps and TED hose Weight-Bearing as tolerated to left leg  J. Horris Latino, PA-C [facility] 11/07/2014, 7:36 AM

## 2014-11-07 NOTE — Plan of Care (Signed)
Problem: Consults Goal: Diagnosis- Total Joint Replacement Primary Total Knee     

## 2014-11-07 NOTE — Progress Notes (Signed)
Physical Therapy Treatment Patient Details Name: Natalie Schneider MRN: [id number] DOB: 04-05-1924 Today's Date: 11/07/2014    History of Present Illness L TKA 6/22    PT Comments    Pt is hesitant and cautious with mobility/ambulation, but ultimately does well and is willing to participate.  She still requires an immobilizer with WBing secondary to quad weakness, but ultimately shows good effort and willingness to participate with PT as best she can.    Follow Up Recommendations  SNF     Equipment Recommendations       Recommendations for Other Services       Precautions / Restrictions Precautions Precautions: Fall Restrictions Weight Bearing Restrictions: Yes LLE Weight Bearing: Weight bearing as tolerated    Mobility  Bed Mobility Overal bed mobility: Needs Assistance Bed Mobility: Supine to Sit     Supine to sit: Mod assist        Transfers Overall transfer level: Needs assistance Equipment used: Rolling walker (2 wheeled) Transfers: Sit to/from Stand Sit to Stand: Min assist            Ambulation/Gait Ambulation/Gait assistance: Min assist Ambulation Distance (Feet): 30 Feet Assistive device: Rolling walker (2 wheeled)       General Gait Details: pt again with slow, cautious gait, KI donned, pt fatigued with the effort, but ultiamtely is able to increased distance/tolerance   Stairs            Wheelchair Mobility    Modified Rankin (Stroke Patients Only)       Balance                                    Cognition Arousal/Alertness: Awake/alert Behavior During Therapy: WFL for tasks assessed/performed Overall Cognitive Status: Within Functional Limits for tasks assessed                      Exercises Total Joint Exercises Ankle Circles/Pumps: AROM;Strengthening;10 reps Quad Sets: AROM;Strengthening;10 reps Heel Slides: PROM;AAROM;10 reps Hip ABduction/ADduction: AROM;10 reps Straight Leg Raises:  AAROM;Strengthening;10 reps Goniometric ROM: 2-82    General Comments        Pertinent Vitals/Pain Pain Assessment: 0-10 Pain Score:  (minimal at rest, increases with activity)    Home Living                      Prior Function            PT Goals (current goals can now be found in the care plan section) Progress towards PT goals: Progressing toward goals    Frequency  BID    PT Plan Current plan remains appropriate    Co-evaluation             End of Session Equipment Utilized During Treatment: Gait belt Activity Tolerance: Patient tolerated treatment well Patient left: with chair alarm set     Time: 6269-4854 PT Time Calculation (min) (ACUTE ONLY): 28 min  Charges:  $Gait Training: 8-22 mins $Therapeutic Exercise: 8-22 mins                    G Codes:     Loran Senters, PT, DPT [phone]  Malachi Pro 11/07/2014, 2:32 PM

## 2014-11-08 DIAGNOSIS — I10 Essential (primary) hypertension: Secondary | ICD-10-CM | POA: Diagnosis not present

## 2014-11-08 DIAGNOSIS — M159 Polyosteoarthritis, unspecified: Secondary | ICD-10-CM | POA: Diagnosis not present

## 2014-11-08 DIAGNOSIS — R2689 Other abnormalities of gait and mobility: Secondary | ICD-10-CM | POA: Diagnosis not present

## 2014-11-08 DIAGNOSIS — I4891 Unspecified atrial fibrillation: Secondary | ICD-10-CM | POA: Diagnosis not present

## 2014-11-08 DIAGNOSIS — K5900 Constipation, unspecified: Secondary | ICD-10-CM | POA: Diagnosis not present

## 2014-11-08 DIAGNOSIS — J449 Chronic obstructive pulmonary disease, unspecified: Secondary | ICD-10-CM | POA: Diagnosis not present

## 2014-11-08 DIAGNOSIS — M1990 Unspecified osteoarthritis, unspecified site: Secondary | ICD-10-CM | POA: Diagnosis not present

## 2014-11-08 DIAGNOSIS — M810 Age-related osteoporosis without current pathological fracture: Secondary | ICD-10-CM | POA: Diagnosis not present

## 2014-11-08 DIAGNOSIS — J441 Chronic obstructive pulmonary disease with (acute) exacerbation: Secondary | ICD-10-CM | POA: Diagnosis not present

## 2014-11-08 DIAGNOSIS — Z471 Aftercare following joint replacement surgery: Secondary | ICD-10-CM | POA: Diagnosis not present

## 2014-11-08 DIAGNOSIS — I2510 Atherosclerotic heart disease of native coronary artery without angina pectoris: Secondary | ICD-10-CM | POA: Diagnosis not present

## 2014-11-08 DIAGNOSIS — I429 Cardiomyopathy, unspecified: Secondary | ICD-10-CM | POA: Diagnosis not present

## 2014-11-08 DIAGNOSIS — M6281 Muscle weakness (generalized): Secondary | ICD-10-CM | POA: Diagnosis not present

## 2014-11-08 DIAGNOSIS — E871 Hypo-osmolality and hyponatremia: Secondary | ICD-10-CM | POA: Diagnosis not present

## 2014-11-08 DIAGNOSIS — Z96652 Presence of left artificial knee joint: Secondary | ICD-10-CM | POA: Diagnosis not present

## 2014-11-08 LAB — BASIC METABOLIC PANEL
Anion gap: 7 (ref 5–15)
BUN: 8 mg/dL (ref 6–20)
CHLORIDE: 89 mmol/L — AB (ref 101–111)
CO2: 33 mmol/L — ABNORMAL HIGH (ref 22–32)
Calcium: 8.3 mg/dL — ABNORMAL LOW (ref 8.9–10.3)
Creatinine, Ser: 0.5 mg/dL (ref 0.44–1.00)
GFR calc Af Amer: >60 mL/min (ref >60)
GFR calc non Af Amer: >60 mL/min (ref >60)
GLUCOSE: 120 mg/dL — AB (ref 65–99)
Potassium: 3.5 mmol/L (ref 3.5–5.1)
Sodium: 129 mmol/L — ABNORMAL LOW (ref 135–145)

## 2014-11-08 LAB — CBC
HCT: 29.2 % — ABNORMAL LOW (ref 35.0–47.0)
Hemoglobin: 10.6 g/dL — ABNORMAL LOW (ref 12.0–16.0)
MCH: 34.6 pg — ABNORMAL HIGH (ref 26.0–34.0)
MCHC: 36.2 g/dL — ABNORMAL HIGH (ref 32.0–36.0)
MCV: 95.4 fL (ref 80.0–100.0)
Platelets: 186 10*3/uL (ref 150–440)
RBC: 3.06 MIL/uL — ABNORMAL LOW (ref 3.80–5.20)
RDW: 13 % (ref 11.5–14.5)
WBC: 7.5 10*3/uL (ref 3.6–11.0)

## 2014-11-08 MED ORDER — FLEET ENEMA 7-19 GM/118ML RE ENEM: 1.0000 | ENEMA | Freq: Every day | RECTAL | Status: DC | PRN

## 2014-11-08 MED ORDER — BISACODYL 10 MG RE SUPP
10.0000 mg | Freq: Once | RECTAL | Status: AC
Start: 2014-11-08 — End: 2014-11-08
  Administered 2014-11-08: 10 mg via RECTAL

## 2014-11-08 NOTE — Progress Notes (Signed)
Physical Therapy Treatment Patient Details Name: MARIO YOHN MRN: [id number] DOB: Jul 24, 1923 Today's Date: 11/08/2014    History of Present Illness L TKA 6/22    PT Comments    Pt showing good effort, but remains minimally confused and pain limited.  She is making gains with ambulation, ROM and strength.  Pt needs much cuing and encouragement but participates well.   Follow Up Recommendations  SNF     Equipment Recommendations       Recommendations for Other Services       Precautions / Restrictions Precautions Precautions: Fall Required Braces or Orthoses: Knee Immobilizer - Left Restrictions Weight Bearing Restrictions: Yes LLE Weight Bearing: Weight bearing as tolerated    Mobility  Bed Mobility Overal bed mobility: Needs Assistance Bed Mobility: Supine to Sit;Sit to Supine     Supine to sit: Min assist        Transfers Overall transfer level: Needs assistance Equipment used: Rolling walker (2 wheeled)   Sit to Stand: Min assist            Ambulation/Gait Ambulation/Gait assistance: Min assist Ambulation Distance (Feet): 50 Feet Assistive device: Rolling walker (2 wheeled)       General Gait Details: pt again very slow, cautious, KI donned, pt fatigued with the effort, but ultimately is able to increase distance/tolerance   Stairs            Wheelchair Mobility    Modified Rankin (Stroke Patients Only)       Balance                                    Cognition Arousal/Alertness: Awake/alert Behavior During Therapy: WFL for tasks assessed/performed Overall Cognitive Status: Within Functional Limits for tasks assessed                      Exercises Total Joint Exercises Ankle Circles/Pumps: AROM;Strengthening;10 reps Quad Sets: AROM;Strengthening;10 reps Short Arc Quad: AAROM;10 reps Heel Slides: PROM;AAROM;10 reps Hip ABduction/ADduction: AROM;10 reps Straight Leg Raises: AAROM;Strengthening;10  reps Goniometric ROM: 1-91    General Comments        Pertinent Vitals/Pain Pain Score: 5     Home Living                      Prior Function            PT Goals (current goals can now be found in the care plan section) Progress towards PT goals: Progressing toward goals    Frequency  BID    PT Plan Current plan remains appropriate    Co-evaluation             End of Session Equipment Utilized During Treatment: Gait belt Activity Tolerance: Patient tolerated treatment well Patient left: with bed alarm set     Time: 7846-9629 PT Time Calculation (min) (ACUTE ONLY): 43 min  Charges:  $Gait Training: 8-22 mins $Therapeutic Exercise: 23-37 mins                    G Codes:     Loran Senters, PT, DPT [phone]  Malachi Pro 11/08/2014, 11:17 AM

## 2014-11-08 NOTE — Progress Notes (Signed)
Patient is medically stable for D/C to [facility] today. Clinical Child psychotherapist (CSW) contacted Consulting civil engineer at [employer] who reported that patient can come today and is going to room 202-A. RN will call report at [phone]. Patient's daughter Erskine Squibb will provide transport for patient. Daughter Erskine Squibb is at bedside and aware of above. Please reconsult if future social work needs arise. CSW signing off.   Jetta Lout, LCSWA [phone]

## 2014-11-08 NOTE — Progress Notes (Signed)
  Subjective: 3 Days Post-Op Procedure(s) (LRB): TOTAL KNEE ARTHROPLASTY (Left) Patient reports pain as mild.   Patient is well with mild complaint of left lateral foot burning pain. No Back pain. Plan is to go Skilled nursing facility today pending BM Negative for chest pain and shortness of breath Fever: none  Objective: Vital signs in last 24 hours: Temp:  [98.1 F (36.7 C)-98.4 F (36.9 C)] 98.4 F (36.9 C) (06/25 0820) Pulse Rate:  [40-82] 82 (06/25 0820) Resp:  [16-18] 16 (06/25 0820) BP: (135-147)/(54-82) 139/61 mmHg (06/25 0820) SpO2:  [92 %-98 %] 96 % (06/25 0820)  Intake/Output from previous day:  Intake/Output Summary (Last 24 hours) at 11/08/14 0905 Last data filed at 11/08/14 0725  Gross per 24 hour  Intake    120 ml  Output   1875 ml  Net  -1755 ml    Intake/Output this shift: Total I/O In: -  Out: 400 [Urine:400]  Labs:  Recent Labs  11/05/14 1351 11/06/14 0438 11/07/14 0446 11/08/14 0325  HGB 12.2 11.3* 10.6* 10.6*    Recent Labs  11/07/14 0446 11/08/14 0325  WBC 7.3 7.5  RBC 3.04* 3.06*  HCT 28.9* 29.2*  PLT 166 186    Recent Labs  11/07/14 0446 11/08/14 0325  NA 123* 129*  K 4.0 3.5  CL 87* 89*  CO2 30 33*  BUN 9 8  CREATININE 0.58 0.50  GLUCOSE 119* 120*  CALCIUM 8.0* 8.3*   No results for input(s): LABPT, INR in the last 72 hours.   EXAM General - Patient is Alert, Appropriate and Oriented Extremity - Neurologically intact ABD soft Dorsiflexion/Plantar flexion intact Incision: no drainage  No warmth erythema or tenderness to left foot. - homans sign Dressing/Incision - CDI Motor Function - intact, moving foot and toes well on exam.   Past Medical History  Diagnosis Date  . Hypertension   . GERD (gastroesophageal reflux disease)   . Arthritis   . Environmental allergies   . Cough   . Atrial fibrillation   . COPD (chronic obstructive pulmonary disease)     Assessment/Plan: 3 Days Post-Op Procedure(s)  (LRB): TOTAL KNEE ARTHROPLASTY (Left) Active Problems:   S/P total knee replacement hyponatremia  Estimated body mass index is 28.48 kg/(m^2) as calculated from the following:   Height as of this encounter: 4\' 11"  (1.499 m).   Weight as of this encounter: 64.003 kg (141 lb 1.6 oz).  Plan to discharge to rehab today pending BM Follow up with [facility] in 2 weeks Resume regular diet   DVT Prophylaxis - Lovenox, Foot Pumps and TED hose Weight-Bearing as tolerated to left leg  Evon Slack, PA-C [facility] Orthopa[facility] Surgery 11/08/2014, 9:05 AM

## 2014-11-08 NOTE — Discharge Instructions (Signed)
Diet: As you were doing prior to hospitalization   Shower:  May shower but keep the wounds dry, use an occlusive plastic wrap, NO SOAKING IN TUB.  If the bandage gets wet, change with a clean dry gauze.  Dressing:  You may change your dressing as needed. Change the dressing with sterile gauze dressing.    Activity:  Increase activity slowly as tolerated, but follow the weight bearing instructions below.  No lifting or driving for 6 weeks.  Weight Bearing:   Weight bearing as tolerated to left lower extremity  To prevent constipation: you may use a stool softener such as -  Colace (over the counter) 100 mg by mouth twice a day  Drink plenty of fluids (prune juice may be helpful) and high fiber foods Miralax (over the counter) for constipation as needed.    Itching:  If you experience itching with your medications, try taking only a single pain pill, or even half a pain pill at a time.  You may take up to 10 pain pills per day, and you can also use benadryl over the counter for itching or also to help with sleep.   Precautions:  If you experience chest pain or shortness of breath - call 911 immediately for transfer to the hospital emergency department!!  If you develop a fever greater that 101 F, purulent drainage from wound, increased redness or drainage from wound, or calf pain-Call [facility]                                              Follow- Up Appointment:  Please call for an appointment to be seen in 11 days with Dr. Gavin Potters.

## 2014-11-08 NOTE — Clinical Social Work Placement (Signed)
   CLINICAL SOCIAL WORK PLACEMENT  NOTE  Date:  11/08/2014  Patient Details  Name: Natalie Schneider MRN: [id number] Date of Birth: September 01, 1923  Clinical Social Work is seeking post-discharge placement for this patient at the Skilled  Nursing Facility level of care (*CSW will initial, date and re-position this form in  chart as items are completed):  Yes   Patient/family provided with [facility] Clinical Social Work Department's list of facilities offering this level of care within the geographic area requested by the patient (or if unable, by the patient's family).  Yes   Patient/family informed of their freedom to choose among providers that offer the needed level of care, that participate in Medicare, Medicaid or managed care program needed by the patient, have an available bed and are willing to accept the patient.  Yes   Patient/family informed of [facility]'s ownership interest in [facility] and [facility] as well as of the fact that they are under no obligation to receive care at these facilities.  PASRR submitted to EDS on 11/05/14     PASRR number received on 11/05/14     Existing PASRR number confirmed on       FL2 transmitted to all facilities in geographic area requested by pt/family on 11/05/14     FL2 transmitted to all facilities within larger geographic area on       Patient informed that his/her managed care company has contracts with or will negotiate with certain facilities, including the following:        Yes   Patient/family informed of bed offers received.  Patient chooses bed at  [facility] )     Physician recommends and patient chooses bed at      Patient to be transferred to  [facility]) on 11/08/14.  Patient to be transferred to facility by  (Daughter's personl vehicle)     Patient family notified on 11/08/14 of transfer.  Name of family member notified:   (Daughter was at bedside and aware of D/C. )     PHYSICIAN        Additional Comment:    _______________________________________________ Haig Prophet, LCSW 11/08/2014, 10:47 AM

## 2014-11-10 DIAGNOSIS — K5900 Constipation, unspecified: Secondary | ICD-10-CM | POA: Diagnosis not present

## 2014-11-10 DIAGNOSIS — J441 Chronic obstructive pulmonary disease with (acute) exacerbation: Secondary | ICD-10-CM | POA: Diagnosis not present

## 2014-11-10 DIAGNOSIS — I2510 Atherosclerotic heart disease of native coronary artery without angina pectoris: Secondary | ICD-10-CM | POA: Diagnosis not present

## 2014-11-10 DIAGNOSIS — M1990 Unspecified osteoarthritis, unspecified site: Secondary | ICD-10-CM | POA: Diagnosis not present

## 2014-11-13 DIAGNOSIS — E871 Hypo-osmolality and hyponatremia: Secondary | ICD-10-CM | POA: Diagnosis not present

## 2014-11-13 LAB — BASIC METABOLIC PANEL
ANION GAP: 9 (ref 5–15)
BUN: 14 mg/dL (ref 6–20)
CHLORIDE: 93 mmol/L — AB (ref 101–111)
CO2: 30 mmol/L (ref 22–32)
CREATININE: 0.57 mg/dL (ref 0.44–1.00)
Calcium: 8.7 mg/dL — ABNORMAL LOW (ref 8.9–10.3)
GFR calc non Af Amer: >60 mL/min (ref >60)
Glucose, Bld: 146 mg/dL — ABNORMAL HIGH (ref 65–99)
POTASSIUM: 3.6 mmol/L (ref 3.5–5.1)
SODIUM: 132 mmol/L — AB (ref 135–145)

## 2014-11-14 ENCOUNTER — Encounter
Admission: RE | Admit: 2014-11-14 | Discharge: 2014-11-14 | Disposition: A | Discharge status: Home / Self Care | Payer: Commercial Managed Care - HMO | Source: Ambulatory Visit | Attending: Internal Medicine | Admitting: Internal Medicine

## 2014-11-14 DIAGNOSIS — E871 Hypo-osmolality and hyponatremia: Secondary | ICD-10-CM | POA: Insufficient documentation

## 2014-11-24 DIAGNOSIS — M159 Polyosteoarthritis, unspecified: Secondary | ICD-10-CM | POA: Diagnosis not present

## 2014-11-24 DIAGNOSIS — J449 Chronic obstructive pulmonary disease, unspecified: Secondary | ICD-10-CM | POA: Diagnosis not present

## 2014-11-24 DIAGNOSIS — I4891 Unspecified atrial fibrillation: Secondary | ICD-10-CM | POA: Diagnosis not present

## 2014-11-24 DIAGNOSIS — I429 Cardiomyopathy, unspecified: Secondary | ICD-10-CM | POA: Diagnosis not present

## 2014-11-28 DIAGNOSIS — I429 Cardiomyopathy, unspecified: Secondary | ICD-10-CM | POA: Diagnosis not present

## 2014-11-28 DIAGNOSIS — Z471 Aftercare following joint replacement surgery: Secondary | ICD-10-CM | POA: Diagnosis not present

## 2014-11-28 DIAGNOSIS — Z96652 Presence of left artificial knee joint: Secondary | ICD-10-CM | POA: Diagnosis not present

## 2014-11-28 DIAGNOSIS — J449 Chronic obstructive pulmonary disease, unspecified: Secondary | ICD-10-CM | POA: Diagnosis not present

## 2014-11-28 DIAGNOSIS — M6281 Muscle weakness (generalized): Secondary | ICD-10-CM | POA: Diagnosis not present

## 2014-11-28 DIAGNOSIS — I10 Essential (primary) hypertension: Secondary | ICD-10-CM | POA: Diagnosis not present

## 2014-11-28 DIAGNOSIS — I4891 Unspecified atrial fibrillation: Secondary | ICD-10-CM | POA: Diagnosis not present

## 2014-12-01 DIAGNOSIS — I429 Cardiomyopathy, unspecified: Secondary | ICD-10-CM | POA: Diagnosis not present

## 2014-12-01 DIAGNOSIS — J449 Chronic obstructive pulmonary disease, unspecified: Secondary | ICD-10-CM | POA: Diagnosis not present

## 2014-12-01 DIAGNOSIS — Z96652 Presence of left artificial knee joint: Secondary | ICD-10-CM | POA: Diagnosis not present

## 2014-12-01 DIAGNOSIS — M6281 Muscle weakness (generalized): Secondary | ICD-10-CM | POA: Diagnosis not present

## 2014-12-01 DIAGNOSIS — Z471 Aftercare following joint replacement surgery: Secondary | ICD-10-CM | POA: Diagnosis not present

## 2014-12-01 DIAGNOSIS — I4891 Unspecified atrial fibrillation: Secondary | ICD-10-CM | POA: Diagnosis not present

## 2014-12-01 DIAGNOSIS — I10 Essential (primary) hypertension: Secondary | ICD-10-CM | POA: Diagnosis not present

## 2014-12-03 DIAGNOSIS — J449 Chronic obstructive pulmonary disease, unspecified: Secondary | ICD-10-CM | POA: Diagnosis not present

## 2014-12-03 DIAGNOSIS — I10 Essential (primary) hypertension: Secondary | ICD-10-CM | POA: Diagnosis not present

## 2014-12-03 DIAGNOSIS — I429 Cardiomyopathy, unspecified: Secondary | ICD-10-CM | POA: Diagnosis not present

## 2014-12-03 DIAGNOSIS — Z471 Aftercare following joint replacement surgery: Secondary | ICD-10-CM | POA: Diagnosis not present

## 2014-12-03 DIAGNOSIS — Z96652 Presence of left artificial knee joint: Secondary | ICD-10-CM | POA: Diagnosis not present

## 2014-12-03 DIAGNOSIS — M6281 Muscle weakness (generalized): Secondary | ICD-10-CM | POA: Diagnosis not present

## 2014-12-03 DIAGNOSIS — I4891 Unspecified atrial fibrillation: Secondary | ICD-10-CM | POA: Diagnosis not present

## 2014-12-05 DIAGNOSIS — J449 Chronic obstructive pulmonary disease, unspecified: Secondary | ICD-10-CM | POA: Diagnosis not present

## 2014-12-05 DIAGNOSIS — Z96652 Presence of left artificial knee joint: Secondary | ICD-10-CM | POA: Diagnosis not present

## 2014-12-05 DIAGNOSIS — M6281 Muscle weakness (generalized): Secondary | ICD-10-CM | POA: Diagnosis not present

## 2014-12-05 DIAGNOSIS — I4891 Unspecified atrial fibrillation: Secondary | ICD-10-CM | POA: Diagnosis not present

## 2014-12-05 DIAGNOSIS — I10 Essential (primary) hypertension: Secondary | ICD-10-CM | POA: Diagnosis not present

## 2014-12-05 DIAGNOSIS — Z471 Aftercare following joint replacement surgery: Secondary | ICD-10-CM | POA: Diagnosis not present

## 2014-12-05 DIAGNOSIS — I429 Cardiomyopathy, unspecified: Secondary | ICD-10-CM | POA: Diagnosis not present

## 2014-12-07 DIAGNOSIS — I700 Atherosclerosis of aorta: Secondary | ICD-10-CM | POA: Diagnosis not present

## 2014-12-07 DIAGNOSIS — I10 Essential (primary) hypertension: Secondary | ICD-10-CM | POA: Diagnosis not present

## 2014-12-07 DIAGNOSIS — J42 Unspecified chronic bronchitis: Secondary | ICD-10-CM | POA: Diagnosis not present

## 2014-12-07 DIAGNOSIS — I480 Paroxysmal atrial fibrillation: Secondary | ICD-10-CM | POA: Diagnosis not present

## 2014-12-08 DIAGNOSIS — Z471 Aftercare following joint replacement surgery: Secondary | ICD-10-CM | POA: Diagnosis not present

## 2014-12-08 DIAGNOSIS — J449 Chronic obstructive pulmonary disease, unspecified: Secondary | ICD-10-CM | POA: Diagnosis not present

## 2014-12-08 DIAGNOSIS — I429 Cardiomyopathy, unspecified: Secondary | ICD-10-CM | POA: Diagnosis not present

## 2014-12-08 DIAGNOSIS — I10 Essential (primary) hypertension: Secondary | ICD-10-CM | POA: Diagnosis not present

## 2014-12-08 DIAGNOSIS — M6281 Muscle weakness (generalized): Secondary | ICD-10-CM | POA: Diagnosis not present

## 2014-12-08 DIAGNOSIS — I4891 Unspecified atrial fibrillation: Secondary | ICD-10-CM | POA: Diagnosis not present

## 2014-12-08 DIAGNOSIS — Z96652 Presence of left artificial knee joint: Secondary | ICD-10-CM | POA: Diagnosis not present

## 2014-12-10 DIAGNOSIS — M6281 Muscle weakness (generalized): Secondary | ICD-10-CM | POA: Diagnosis not present

## 2014-12-10 DIAGNOSIS — I4891 Unspecified atrial fibrillation: Secondary | ICD-10-CM | POA: Diagnosis not present

## 2014-12-10 DIAGNOSIS — Z96652 Presence of left artificial knee joint: Secondary | ICD-10-CM | POA: Diagnosis not present

## 2014-12-10 DIAGNOSIS — I429 Cardiomyopathy, unspecified: Secondary | ICD-10-CM | POA: Diagnosis not present

## 2014-12-10 DIAGNOSIS — J449 Chronic obstructive pulmonary disease, unspecified: Secondary | ICD-10-CM | POA: Diagnosis not present

## 2014-12-10 DIAGNOSIS — Z471 Aftercare following joint replacement surgery: Secondary | ICD-10-CM | POA: Diagnosis not present

## 2014-12-10 DIAGNOSIS — I10 Essential (primary) hypertension: Secondary | ICD-10-CM | POA: Diagnosis not present

## 2014-12-12 DIAGNOSIS — I10 Essential (primary) hypertension: Secondary | ICD-10-CM | POA: Diagnosis not present

## 2014-12-12 DIAGNOSIS — M6281 Muscle weakness (generalized): Secondary | ICD-10-CM | POA: Diagnosis not present

## 2014-12-12 DIAGNOSIS — Z96652 Presence of left artificial knee joint: Secondary | ICD-10-CM | POA: Diagnosis not present

## 2014-12-12 DIAGNOSIS — I4891 Unspecified atrial fibrillation: Secondary | ICD-10-CM | POA: Diagnosis not present

## 2014-12-12 DIAGNOSIS — I429 Cardiomyopathy, unspecified: Secondary | ICD-10-CM | POA: Diagnosis not present

## 2014-12-12 DIAGNOSIS — J449 Chronic obstructive pulmonary disease, unspecified: Secondary | ICD-10-CM | POA: Diagnosis not present

## 2014-12-12 DIAGNOSIS — Z471 Aftercare following joint replacement surgery: Secondary | ICD-10-CM | POA: Diagnosis not present

## 2014-12-15 DIAGNOSIS — Z96652 Presence of left artificial knee joint: Secondary | ICD-10-CM | POA: Diagnosis not present

## 2014-12-15 DIAGNOSIS — I10 Essential (primary) hypertension: Secondary | ICD-10-CM | POA: Diagnosis not present

## 2014-12-15 DIAGNOSIS — I4891 Unspecified atrial fibrillation: Secondary | ICD-10-CM | POA: Diagnosis not present

## 2014-12-15 DIAGNOSIS — J449 Chronic obstructive pulmonary disease, unspecified: Secondary | ICD-10-CM | POA: Diagnosis not present

## 2014-12-15 DIAGNOSIS — M6281 Muscle weakness (generalized): Secondary | ICD-10-CM | POA: Diagnosis not present

## 2014-12-15 DIAGNOSIS — Z471 Aftercare following joint replacement surgery: Secondary | ICD-10-CM | POA: Diagnosis not present

## 2014-12-15 DIAGNOSIS — I429 Cardiomyopathy, unspecified: Secondary | ICD-10-CM | POA: Diagnosis not present

## 2014-12-17 DIAGNOSIS — J449 Chronic obstructive pulmonary disease, unspecified: Secondary | ICD-10-CM | POA: Diagnosis not present

## 2014-12-17 DIAGNOSIS — I10 Essential (primary) hypertension: Secondary | ICD-10-CM | POA: Diagnosis not present

## 2014-12-17 DIAGNOSIS — I429 Cardiomyopathy, unspecified: Secondary | ICD-10-CM | POA: Diagnosis not present

## 2014-12-17 DIAGNOSIS — Z96652 Presence of left artificial knee joint: Secondary | ICD-10-CM | POA: Diagnosis not present

## 2014-12-17 DIAGNOSIS — M6281 Muscle weakness (generalized): Secondary | ICD-10-CM | POA: Diagnosis not present

## 2014-12-17 DIAGNOSIS — I4891 Unspecified atrial fibrillation: Secondary | ICD-10-CM | POA: Diagnosis not present

## 2014-12-17 DIAGNOSIS — Z471 Aftercare following joint replacement surgery: Secondary | ICD-10-CM | POA: Diagnosis not present

## 2014-12-19 DIAGNOSIS — J449 Chronic obstructive pulmonary disease, unspecified: Secondary | ICD-10-CM | POA: Diagnosis not present

## 2014-12-19 DIAGNOSIS — M6281 Muscle weakness (generalized): Secondary | ICD-10-CM | POA: Diagnosis not present

## 2014-12-19 DIAGNOSIS — I10 Essential (primary) hypertension: Secondary | ICD-10-CM | POA: Diagnosis not present

## 2014-12-19 DIAGNOSIS — Z471 Aftercare following joint replacement surgery: Secondary | ICD-10-CM | POA: Diagnosis not present

## 2014-12-19 DIAGNOSIS — I4891 Unspecified atrial fibrillation: Secondary | ICD-10-CM | POA: Diagnosis not present

## 2014-12-19 DIAGNOSIS — I429 Cardiomyopathy, unspecified: Secondary | ICD-10-CM | POA: Diagnosis not present

## 2014-12-19 DIAGNOSIS — Z96652 Presence of left artificial knee joint: Secondary | ICD-10-CM | POA: Diagnosis not present

## 2014-12-22 DIAGNOSIS — M6281 Muscle weakness (generalized): Secondary | ICD-10-CM | POA: Diagnosis not present

## 2014-12-22 DIAGNOSIS — J449 Chronic obstructive pulmonary disease, unspecified: Secondary | ICD-10-CM | POA: Diagnosis not present

## 2014-12-22 DIAGNOSIS — I429 Cardiomyopathy, unspecified: Secondary | ICD-10-CM | POA: Diagnosis not present

## 2014-12-22 DIAGNOSIS — Z471 Aftercare following joint replacement surgery: Secondary | ICD-10-CM | POA: Diagnosis not present

## 2014-12-22 DIAGNOSIS — I10 Essential (primary) hypertension: Secondary | ICD-10-CM | POA: Diagnosis not present

## 2014-12-22 DIAGNOSIS — Z96652 Presence of left artificial knee joint: Secondary | ICD-10-CM | POA: Diagnosis not present

## 2014-12-22 DIAGNOSIS — I4891 Unspecified atrial fibrillation: Secondary | ICD-10-CM | POA: Diagnosis not present

## 2014-12-23 DIAGNOSIS — Z96652 Presence of left artificial knee joint: Secondary | ICD-10-CM | POA: Diagnosis not present

## 2015-01-07 ENCOUNTER — Other Ambulatory Visit: Payer: Self-pay | Admitting: Internal Medicine

## 2015-01-07 ENCOUNTER — Ambulatory Visit
Admission: RE | Admit: 2015-01-07 | Discharge: 2015-01-07 | Disposition: A | Discharge status: Home / Self Care | Payer: Commercial Managed Care - HMO | Source: Ambulatory Visit | Attending: Internal Medicine | Admitting: Internal Medicine

## 2015-01-07 DIAGNOSIS — M79662 Pain in left lower leg: Secondary | ICD-10-CM

## 2015-01-07 DIAGNOSIS — M7989 Other specified soft tissue disorders: Principal | ICD-10-CM

## 2015-01-07 DIAGNOSIS — M79605 Pain in left leg: Secondary | ICD-10-CM | POA: Diagnosis not present

## 2015-01-15 DIAGNOSIS — Z96652 Presence of left artificial knee joint: Secondary | ICD-10-CM | POA: Diagnosis not present

## 2015-01-21 DIAGNOSIS — R05 Cough: Secondary | ICD-10-CM | POA: Diagnosis not present

## 2015-01-21 DIAGNOSIS — R600 Localized edema: Secondary | ICD-10-CM | POA: Diagnosis not present

## 2015-01-21 DIAGNOSIS — I5022 Chronic systolic (congestive) heart failure: Secondary | ICD-10-CM | POA: Diagnosis not present

## 2015-01-21 DIAGNOSIS — I10 Essential (primary) hypertension: Secondary | ICD-10-CM | POA: Diagnosis not present

## 2015-01-28 DIAGNOSIS — I5022 Chronic systolic (congestive) heart failure: Secondary | ICD-10-CM | POA: Diagnosis not present

## 2015-01-28 DIAGNOSIS — I340 Nonrheumatic mitral (valve) insufficiency: Secondary | ICD-10-CM | POA: Diagnosis not present

## 2015-01-28 DIAGNOSIS — I480 Paroxysmal atrial fibrillation: Secondary | ICD-10-CM | POA: Diagnosis not present

## 2015-01-28 DIAGNOSIS — I447 Left bundle-branch block, unspecified: Secondary | ICD-10-CM | POA: Diagnosis not present

## 2015-02-17 DIAGNOSIS — H524 Presbyopia: Secondary | ICD-10-CM | POA: Diagnosis not present

## 2015-02-17 DIAGNOSIS — H5210 Myopia, unspecified eye: Secondary | ICD-10-CM | POA: Diagnosis not present

## 2015-02-17 DIAGNOSIS — Z961 Presence of intraocular lens: Secondary | ICD-10-CM | POA: Diagnosis not present

## 2015-03-01 DIAGNOSIS — J301 Allergic rhinitis due to pollen: Secondary | ICD-10-CM | POA: Diagnosis not present

## 2015-03-01 DIAGNOSIS — J441 Chronic obstructive pulmonary disease with (acute) exacerbation: Secondary | ICD-10-CM | POA: Diagnosis not present

## 2015-03-01 DIAGNOSIS — R05 Cough: Secondary | ICD-10-CM | POA: Diagnosis not present

## 2015-04-01 ENCOUNTER — Inpatient Hospital Stay
Admission: EM | Admit: 2015-04-01 | Discharge: 2015-04-04 | DRG: 244 | Disposition: A | Discharge status: Home / Self Care | Payer: Commercial Managed Care - HMO | Attending: Internal Medicine | Admitting: Internal Medicine

## 2015-04-01 ENCOUNTER — Emergency Department: Payer: Commercial Managed Care - HMO

## 2015-04-01 DIAGNOSIS — Z79899 Other long term (current) drug therapy: Secondary | ICD-10-CM

## 2015-04-01 DIAGNOSIS — Z87891 Personal history of nicotine dependence: Secondary | ICD-10-CM | POA: Diagnosis not present

## 2015-04-01 DIAGNOSIS — H269 Unspecified cataract: Secondary | ICD-10-CM | POA: Diagnosis present

## 2015-04-01 DIAGNOSIS — I509 Heart failure, unspecified: Secondary | ICD-10-CM | POA: Diagnosis not present

## 2015-04-01 DIAGNOSIS — Z888 Allergy status to other drugs, medicaments and biological substances status: Secondary | ICD-10-CM | POA: Diagnosis not present

## 2015-04-01 DIAGNOSIS — Z23 Encounter for immunization: Secondary | ICD-10-CM

## 2015-04-01 DIAGNOSIS — M1990 Unspecified osteoarthritis, unspecified site: Secondary | ICD-10-CM | POA: Diagnosis present

## 2015-04-01 DIAGNOSIS — J449 Chronic obstructive pulmonary disease, unspecified: Secondary | ICD-10-CM | POA: Diagnosis not present

## 2015-04-01 DIAGNOSIS — D649 Anemia, unspecified: Secondary | ICD-10-CM | POA: Diagnosis present

## 2015-04-01 DIAGNOSIS — I4891 Unspecified atrial fibrillation: Secondary | ICD-10-CM | POA: Diagnosis not present

## 2015-04-01 DIAGNOSIS — Z96652 Presence of left artificial knee joint: Secondary | ICD-10-CM | POA: Diagnosis present

## 2015-04-01 DIAGNOSIS — R001 Bradycardia, unspecified: Secondary | ICD-10-CM | POA: Diagnosis not present

## 2015-04-01 DIAGNOSIS — I639 Cerebral infarction, unspecified: Secondary | ICD-10-CM | POA: Diagnosis not present

## 2015-04-01 DIAGNOSIS — M7989 Other specified soft tissue disorders: Secondary | ICD-10-CM | POA: Diagnosis not present

## 2015-04-01 DIAGNOSIS — I482 Chronic atrial fibrillation: Secondary | ICD-10-CM | POA: Diagnosis not present

## 2015-04-01 DIAGNOSIS — Z79891 Long term (current) use of opiate analgesic: Secondary | ICD-10-CM

## 2015-04-01 DIAGNOSIS — I442 Atrioventricular block, complete: Secondary | ICD-10-CM | POA: Diagnosis not present

## 2015-04-01 DIAGNOSIS — K219 Gastro-esophageal reflux disease without esophagitis: Secondary | ICD-10-CM | POA: Diagnosis present

## 2015-04-01 DIAGNOSIS — I10 Essential (primary) hypertension: Secondary | ICD-10-CM | POA: Diagnosis not present

## 2015-04-01 DIAGNOSIS — R05 Cough: Secondary | ICD-10-CM | POA: Diagnosis not present

## 2015-04-01 DIAGNOSIS — Z9889 Other specified postprocedural states: Secondary | ICD-10-CM | POA: Diagnosis not present

## 2015-04-01 DIAGNOSIS — Z881 Allergy status to other antibiotic agents status: Secondary | ICD-10-CM | POA: Diagnosis not present

## 2015-04-01 DIAGNOSIS — I459 Conduction disorder, unspecified: Secondary | ICD-10-CM | POA: Diagnosis present

## 2015-04-01 DIAGNOSIS — R531 Weakness: Secondary | ICD-10-CM | POA: Diagnosis not present

## 2015-04-01 DIAGNOSIS — Z882 Allergy status to sulfonamides status: Secondary | ICD-10-CM | POA: Diagnosis not present

## 2015-04-01 DIAGNOSIS — Z950 Presence of cardiac pacemaker: Secondary | ICD-10-CM

## 2015-04-01 LAB — CBC WITH DIFFERENTIAL/PLATELET
BASOS ABS: 0 10*3/uL (ref 0–0.1)
BASOS PCT: 1 %
EOS ABS: 0.1 10*3/uL (ref 0–0.7)
EOS PCT: 2 %
HCT: 36.3 % (ref 35.0–47.0)
Hemoglobin: 12.5 g/dL (ref 12.0–16.0)
LYMPHS PCT: 22 %
Lymphs Abs: 1.2 10*3/uL (ref 1.0–3.6)
MCH: 33.2 pg (ref 26.0–34.0)
MCHC: 34.3 g/dL (ref 32.0–36.0)
MCV: 97 fL (ref 80.0–100.0)
MONO ABS: 0.4 10*3/uL (ref 0.2–0.9)
Monocytes Relative: 8 %
Neutro Abs: 3.7 10*3/uL (ref 1.4–6.5)
Neutrophils Relative %: 67 %
PLATELETS: 210 10*3/uL (ref 150–440)
RBC: 3.75 MIL/uL — AB (ref 3.80–5.20)
RDW: 12.8 % (ref 11.5–14.5)
WBC: 5.5 10*3/uL (ref 3.6–11.0)

## 2015-04-01 LAB — TSH: TSH: 6.096 u[IU]/mL — AB (ref 0.350–4.500)

## 2015-04-01 LAB — TROPONIN I

## 2015-04-01 LAB — BASIC METABOLIC PANEL
ANION GAP: 8 (ref 5–15)
BUN: 26 mg/dL — ABNORMAL HIGH (ref 6–20)
CALCIUM: 9.4 mg/dL (ref 8.9–10.3)
CO2: 24 mmol/L (ref 22–32)
Chloride: 105 mmol/L (ref 101–111)
Creatinine, Ser: 0.91 mg/dL (ref 0.44–1.00)
GFR, EST NON AFRICAN AMERICAN: 54 mL/min — AB (ref >60)
GLUCOSE: 125 mg/dL — AB (ref 65–99)
POTASSIUM: 4.2 mmol/L (ref 3.5–5.1)
SODIUM: 137 mmol/L (ref 135–145)

## 2015-04-01 LAB — DIGOXIN LEVEL: Digoxin Level: 1.2 ng/mL (ref 0.8–2.0)

## 2015-04-01 MED ORDER — GUAIFENESIN 100 MG/5ML PO SOLN
10.0000 mL | ORAL | Status: DC | PRN
  Administered 2015-04-02: 200 mg via ORAL
  Filled 2015-04-01: qty 10

## 2015-04-01 MED ORDER — HYDROCHLOROTHIAZIDE 12.5 MG PO CAPS
12.5000 mg | ORAL_CAPSULE | Freq: Every day | ORAL | Status: DC
  Administered 2015-04-02 – 2015-04-04 (×3): 12.5 mg via ORAL
  Filled 2015-04-01 (×3): qty 1

## 2015-04-01 MED ORDER — ALPRAZOLAM 0.25 MG PO TABS
0.2500 mg | ORAL_TABLET | Freq: Two times a day (BID) | ORAL | Status: DC | PRN
  Administered 2015-04-02 – 2015-04-03 (×2): 0.25 mg via ORAL
  Filled 2015-04-01 (×2): qty 1

## 2015-04-01 MED ORDER — BUSPIRONE HCL 5 MG PO TABS
10.0000 mg | ORAL_TABLET | Freq: Two times a day (BID) | ORAL | Status: DC
  Administered 2015-04-01 – 2015-04-04 (×6): 10 mg via ORAL
  Filled 2015-04-01 (×7): qty 2

## 2015-04-01 MED ORDER — FLUTICASONE PROPIONATE 50 MCG/ACT NA SUSP
2.0000 | Freq: Every day | NASAL | Status: DC
  Administered 2015-04-02 – 2015-04-03 (×2): 2 via NASAL
  Filled 2015-04-01: qty 16

## 2015-04-01 MED ORDER — MENTHOL 3 MG MT LOZG
1.0000 | LOZENGE | OROMUCOSAL | Status: DC | PRN
  Filled 2015-04-01: qty 9

## 2015-04-01 MED ORDER — LOSARTAN POTASSIUM-HCTZ 100-12.5 MG PO TABS: 1.0000 | ORAL_TABLET | Freq: Every day | ORAL | Status: DC

## 2015-04-01 MED ORDER — ASPIRIN EC 81 MG PO TBEC
81.0000 mg | DELAYED_RELEASE_TABLET | Freq: Every day | ORAL | Status: DC
  Administered 2015-04-02 – 2015-04-04 (×3): 81 mg via ORAL
  Filled 2015-04-01 (×3): qty 1

## 2015-04-01 MED ORDER — MONTELUKAST SODIUM 10 MG PO TABS
10.0000 mg | ORAL_TABLET | Freq: Every day | ORAL | Status: DC
  Administered 2015-04-01 – 2015-04-03 (×3): 10 mg via ORAL
  Filled 2015-04-01 (×3): qty 1

## 2015-04-01 MED ORDER — ASPIRIN 81 MG PO CHEW
324.0000 mg | CHEWABLE_TABLET | Freq: Once | ORAL | Status: AC
  Administered 2015-04-01: 324 mg via ORAL
  Filled 2015-04-01: qty 4

## 2015-04-01 MED ORDER — HEPARIN SODIUM (PORCINE) 5000 UNIT/ML IJ SOLN
5000.0000 [IU] | Freq: Three times a day (TID) | INTRAMUSCULAR | Status: DC
  Administered 2015-04-01 – 2015-04-04 (×7): 5000 [IU] via SUBCUTANEOUS
  Filled 2015-04-01 (×7): qty 1

## 2015-04-01 MED ORDER — SODIUM CHLORIDE 0.9 % IJ SOLN
3.0000 mL | Freq: Two times a day (BID) | INTRAMUSCULAR | Status: DC
  Administered 2015-04-01 – 2015-04-03 (×4): 3 mL via INTRAVENOUS

## 2015-04-01 MED ORDER — LOSARTAN POTASSIUM 50 MG PO TABS
100.0000 mg | ORAL_TABLET | Freq: Every day | ORAL | Status: DC
  Administered 2015-04-02 – 2015-04-04 (×3): 100 mg via ORAL
  Filled 2015-04-01 (×3): qty 2

## 2015-04-01 NOTE — ED Provider Notes (Signed)
[facility] Emergency Department Provider Note ____________________________________________  Time seen: Approximately 450 PM  I have reviewed the triage vital signs and the nursing notes.   HISTORY  Chief Complaint Hypertension and Bradycardia    HPI Natalie Schneider is a 81 y.o. female with a history of atrial fibrillation on digoxin who is presenting today with "feeling lousy and weak." The patient is denying any chest pain. She says she has a cough which is unchanged and chronic secondary to COPD.She denies any fever. Denies any nausea vomiting or chest pain. Says she took her blood pressure home and was elevated. The patient denies any changes in her digitoxin dose.   Past Medical History  Diagnosis Date  . Hypertension   . GERD (gastroesophageal reflux disease)   . Arthritis   . Environmental allergies   . Cough   . Atrial fibrillation   . COPD (chronic obstructive pulmonary disease)     Patient Active Problem List   Diagnosis Date Noted  . S/P total knee replacement 11/05/2014    Past Surgical History  Procedure Laterality Date  . Thyroid surgery    . Breast biopsy Left   . Laparoscopic oopherectomy Bilateral   . Cataracts Bilateral   . Total knee arthroplasty Left 11/05/2014    Procedure: TOTAL KNEE ARTHROPLASTY;  Surgeon: Erin SonsHarold [facility], MD;  Location: [facility] ORS;  Service: Orthopedics;  Laterality: Left;    Current Outpatient Rx  Name  Route  Sig  Dispense  Refill  . albuterol (PROVENTIL HFA;VENTOLIN HFA) 108 (90 BASE) MCG/ACT inhaler   Inhalation   Inhale 2 puffs into the lungs every 6 (six) hours as needed for wheezing.         Marland Kitchen. ALPRAZolam (XANAX) 0.25 MG tablet   Oral   Take 0.25 mg by mouth 2 (two) times daily as needed for anxiety.         . benzonatate (TESSALON) 100 MG capsule   Oral   Take by mouth 3 (three) times daily as needed for cough.         . busPIRone (BUSPAR) 5 MG tablet   Oral   Take 5 mg by mouth 2  (two) times daily.         . clotrimazole-betamethasone (LOTRISONE) cream   Topical   Apply 1 application topically 2 (two) times daily.         . diclofenac sodium (VOLTAREN) 1 % GEL   Topical   Apply 4 g topically 4 (four) times daily.         . digoxin (LANOXIN) 0.25 MG tablet   Oral   Take 0.25 mg by mouth daily.         . diphenhydrAMINE (BENADRYL) 25 MG tablet   Oral   Take 25 mg by mouth every 6 (six) hours as needed.         . enoxaparin (LOVENOX) 40 MG/0.4ML injection   Subcutaneous   Inject 0.4 mLs (40 mg total) into the skin daily.   14 Syringe   0   . HYDROcodone-homatropine (HYCODAN) 5-1.5 MG/5ML syrup   Oral   Take 5 mLs by mouth every 6 (six) hours as needed for cough.         . loperamide (IMODIUM) 2 MG capsule   Oral   Take by mouth as needed for diarrhea or loose stools.         Marland Kitchen. losartan-hydrochlorothiazide (HYZAAR) 100-12.5 MG per tablet   Oral   Take 1 tablet  by mouth daily.         . metoprolol succinate (TOPROL-XL) 50 MG 24 hr tablet   Oral   Take 50 mg by mouth daily. Take with or immediately following a meal.         . oxyCODONE (OXY IR/ROXICODONE) 5 MG immediate release tablet   Oral   Take 1-2 tablets (5-10 mg total) by mouth every 4 (four) hours as needed for breakthrough pain.   60 tablet   0   . pantoprazole (PROTONIX) 40 MG tablet   Oral   Take 40 mg by mouth daily.         . raloxifene (EVISTA) 60 MG tablet   Oral   Take 60 mg by mouth daily.           Allergies Amoxicill-clarithro-lansopraz; Clindamycin/lincomycin; Doxycycline; Flagyl; Levaquin; and Septra  Family History  Problem Relation Age of Onset  . Heart disease Mother   . Heart disease Father   . Cancer Sister   . Cancer Brother     Social History Social History  Substance Use Topics  . Smoking status: Former Smoker    Quit date: 10/23/1974  . Smokeless tobacco: Not on file  . Alcohol Use: No    Review of  Systems Constitutional: No fever/chills Eyes: No visual changes. ENT: No sore throat. Cardiovascular: Denies chest pain. Respiratory: Denies shortness of breath. Gastrointestinal: No abdominal pain.  No nausea, no vomiting.  No diarrhea.  No constipation. Genitourinary: Negative for dysuria. Musculoskeletal: Negative for back pain. Skin: Negative for rash. Neurological: Negative for headaches, focal weakness or numbness.  10-point ROS otherwise negative.  ____________________________________________   PHYSICAL EXAM:  VITAL SIGNS: ED Triage Vitals  Enc Vitals Group     BP 04/01/15 1646 159/54 mmHg     Pulse Rate 04/01/15 1646 36     Resp 04/01/15 1646 16     Temp 04/01/15 1646 98.2 F (36.8 C)     Temp Source 04/01/15 1646 Oral     SpO2 04/01/15 1646 100 %     Weight 04/01/15 1646 138 lb (62.596 kg)     Height 04/01/15 1646  (1.499 m)     Head Cir --      Peak Flow --      Pain Score 04/01/15 1649 0     Pain Loc --      Pain Edu? --      Excl. in GC? --     Constitutional: Alert and oriented. Well appearing and in no acute distress. Eyes: Conjunctivae are normal. PERRL. EOMI. Head: Atraumatic. Nose: No congestion/rhinnorhea. Mouth/Throat: Mucous membranes are moist.  Oropharynx non-erythematous. Neck: No stridor.   Cardiovascular: Bradycardic, regular rhythm. Grossly normal heart sounds.  Good peripheral circulation. Respiratory: Normal respiratory effort.  No retractions. Lungs CTAB. Gastrointestinal: Soft and nontender. No distention. No abdominal bruits. No CVA tenderness. Musculoskeletal: No lower extremity tenderness nor edema.  No joint effusions. Neurologic:  Normal speech and language. No gross focal neurologic deficits are appreciated. No gait instability. Skin:  Skin is warm, dry and intact. No rash noted. Psychiatric: Mood and affect are normal. Speech and behavior are normal.  ____________________________________________   LABS (all labs  ordered are listed, but only abnormal results are displayed)  Labs Reviewed  CBC WITH DIFFERENTIAL/PLATELET - Abnormal; Notable for the following:    RBC 3.75 (*)    All other components within normal limits  BASIC METABOLIC PANEL - Abnormal; Notable for the following:  Glucose, Bld 125 (*)    BUN 26 (*)    GFR calc non Af Amer 54 (*)    All other components within normal limits  TROPONIN I  DIGOXIN LEVEL   ____________________________________________  EKG  ED ECG REPORT I, Jones Viviani,  Teena Irani, the attending physician, personally viewed and interpreted this ECG.   Date: 04/01/2015  EKG Time: 1649  Rate: 36  Rhythm: sinus bradycardia  Axis: Left axis deviation  Intervals:right bundle branch block  ST&T Change: Right bundle-branch block is new as well as the T-wave inversions in V2 and V3.  ____________________________________________  RADIOLOGY  No acute cardiopulmonary disease on the chest x-ray. ____________________________________________   PROCEDURES  CRITICAL CARE Performed by: Arelia Longest   Total critical care time: 35 minutes  Critical care time was exclusive of separately billable procedures and treating other patients.  Critical care was necessary to treat or prevent imminent or life-threatening deterioration.  Critical care was time spent personally by me on the following activities: development of treatment plan with patient and/or surrogate as well as nursing, discussions with consultants, evaluation of patient's response to treatment, examination of patient, obtaining history from patient or surrogate, ordering and performing treatments and interventions, ordering and review of laboratory studies, ordering and review of radiographic studies, pulse oximetry and re-evaluation of patient's condition.  Patient monitor on the pacer pads and did not require any intervention. ____________________________________________   INITIAL IMPRESSION /  ASSESSMENT AND PLAN / ED COURSE  Pertinent labs & imaging results that were available during my care of the patient were reviewed by me and considered in my medical decision making (see chart for details).  ----------------------------------------- 6:52 PM on 04/01/2015 -----------------------------------------  Patient resting a little bit this time. Patient did not become hypotensive at any point during her emergency department course. Reviewed the case with Dr. Ihor Austin who works with Dr. Gwen Pounds. Dr. Ihor Austin evaluated the patient the bedside. He believes that the patient will be safe on the telemetry floor. The patient has not had any hemodynamic instability since being in the emergency department. We'll admit to the hospital. Patient was updated as well as the family. Unclear cause of a heart block at this time. Possibly structural heart disease from chronic CHF. Normal digoxin level no overdose of any calcium channel or beta blocker. ____________________________________________   FINAL CLINICAL IMPRESSION(S) / ED DIAGNOSES  Acute bradycardia. Complete Heart block.    Myrna Blazer, MD 04/01/15 [id number]

## 2015-04-01 NOTE — ED Notes (Addendum)
Pt ambulated to bathroom independently at this time with cane, tolerated well, no acute distress noted. Pt denies chest pain or SOB, lightheadedness or dizziness upon standing or ambulating.

## 2015-04-01 NOTE — ED Notes (Signed)
Lab called regarding TSH, will add on at this time

## 2015-04-01 NOTE — ED Notes (Signed)
Pt reports high blood pressure today (175/84), pt reports feeling "lousy and weak" for a "few days". Pt denies dizziness or headache, denies chest pain or shortness of breath.

## 2015-04-01 NOTE — ED Notes (Signed)
Pt given lunch tray at this time, sitting up in bed eating, tolerating well. No acute distress noted at this time, still denies pain

## 2015-04-01 NOTE — Consult Note (Signed)
[facility]  CARDIOLOGY CONSULT NOTE  Patient ID: Natalie Schneider MRN: [id number] DOB/AGE: 1923/06/03 81 y.o.  Admit date: 04/01/2015 Referring Physician Elisabeth PigeonVachhani Primary Physician   Primary Cardiologist [facility] Reason for Consultation bradycardia  HPI: 81 yo female with history of afib treated with digoxin and metoprolol who was brought to her by her family after feing noted to have a slow heart rate. EKG showed afib with slow vr at 35 bpm. She denies syncope but has weakness and fatigue. She has ruled out for an mi. She has not had bradycardia in the past . She is currently hemodynaically stable.   ROS Review of Systems - General ROS: positive for  - fatigue Respiratory ROS: no cough, shortness of breath, or wheezing Cardiovascular ROS: positive for - irregular heartbeat and palpitations Gastrointestinal ROS: no abdominal pain, change in bowel habits, or black or bloody stools Neurological ROS: no TIA or stroke symptoms   Past Medical History  Diagnosis Date  . Hypertension   . GERD (gastroesophageal reflux disease)   . Arthritis   . Environmental allergies   . Cough   . Atrial fibrillation   . COPD (chronic obstructive pulmonary disease)     Family History  Problem Relation Age of Onset  . Heart disease Mother   . Heart disease Father   . Cancer Sister   . Cancer Brother     Social History   Social History  . Marital Status: Widowed    Spouse Name: N/A  . Number of Children: N/A  . Years of Education: N/A   Occupational History  . Not on file.   Social History Main Topics  . Smoking status: Former Smoker    Quit date: 10/23/1974  . Smokeless tobacco: Not on file  . Alcohol Use: No  . Drug Use: No  . Sexual Activity: No   Other Topics Concern  . Not on file   Social History Narrative    Past Surgical History  Procedure Laterality Date  . Thyroid surgery    . Breast biopsy Left   . Laparoscopic oopherectomy  Bilateral   . Cataracts Bilateral   . Total knee arthroplasty Left 11/05/2014    Procedure: TOTAL KNEE ARTHROPLASTY;  Surgeon: Erin SonsHarold [facility], MD;  Location: [facility] ORS;  Service: Orthopedics;  Laterality: Left;      (Not in a hospital admission)  Physical Exam: Blood pressure 154/43, pulse 36, temperature 98.2 F (36.8 C), temperature source Oral, resp. rate 18, height 4\' 11"  (1.499 m), weight 62.596 kg (138 lb), SpO2 98 %.    General appearance: alert and cooperative Neck: no adenopathy, no carotid bruit, no JVD, supple, symmetrical, trachea midline and thyroid not enlarged, symmetric, no tenderness/mass/nodules Resp: clear to auscultation bilaterally Cardio: irregularly irregular rhythm GI: soft, non-tender; bowel sounds normal; no masses,  no organomegaly Extremities: extremities normal, atraumatic, no cyanosis or edema Labs:   Lab Results  Component Value Date   WBC 5.5 04/01/2015   HGB 12.5 04/01/2015   HCT 36.3 04/01/2015   MCV 97.0 04/01/2015   PLT 210 04/01/2015    Recent Labs Lab 04/01/15 1700  NA 137  K 4.2  CL 105  CO2 24  BUN 26*  CREATININE 0.91  CALCIUM 9.4  GLUCOSE 125*   Lab Results  Component Value Date   TROPONINI <0.03 04/01/2015      Radiology: no chf EKG: afib with slow vr  ASSESSMENT AND PLAN:  Pt with history of afib treated  with digoxin and metoprolol who presents with weakenns and fatiufe and noted to have severe bradycardia with hemodynamic stability. Etiology of bradaycardia is likely seocndary to her digoxin and metoprolol. Will stop digoxin and metorplol and follow rate. If rate does not improve after holding meds, consider ppm. OK to go to telemetry Signed: Dalia Heading MD, [facility] 04/01/2015, 9:52 PM

## 2015-04-01 NOTE — ED Notes (Signed)
Lab called regarding dig level, states will add on at this time

## 2015-04-01 NOTE — H&P (Signed)
[facility] [place] at [facility]   PATIENT NAME: Natalie GuardianHelen Markes    MR#:  [id number]  DATE OF BIRTH:  27-Sep-1923  DATE OF ADMISSION:  04/01/2015  PRIMARY CARE PHYSICIAN: Lauro RegulusANDERSON,MARSHALL W., MD   REQUESTING/REFERRING PHYSICIAN: [email or web address]  CHIEF COMPLAINT:   Chief Complaint  Patient presents with  . Hypertension  . Bradycardia    HISTORY OF PRESENT ILLNESS: Natalie Schneider  is a 81 y.o. female with a known history of hypertension, cough, atrial fibrillation, COPD- has been feeling very weak and lousy for last 2-3 days, concerned with this she called her daughter she checked her blood pressure pressure was fine but her heart rate was low so she brought her to emergency room. She was noted to have complete heart block with heart rate of 34-36, patient is completely alert and oriented without any significant symptoms and blood pressure is stable- so ER physician consulted Dr. Lady Garyfath- he saw the patient and suggested to admit her to telemetry for further workup. Patient denies any overdose off her blood pressure or atrial fibrillation medications, but she says that one of the doctor told her in the past that her thyroid is working very low- but she doesn't know how much and she does not have a diagnosis of hypothyroidism.  PAST MEDICAL HISTORY:   Past Medical History  Diagnosis Date  . Hypertension   . GERD (gastroesophageal reflux disease)   . Arthritis   . Environmental allergies   . Cough   . Atrial fibrillation   . COPD (chronic obstructive pulmonary disease)     PAST SURGICAL HISTORY:  Past Surgical History  Procedure Laterality Date  . Thyroid surgery    . Breast biopsy Left   . Laparoscopic oopherectomy Bilateral   . Cataracts Bilateral   . Total knee arthroplasty Left 11/05/2014    Procedure: TOTAL KNEE ARTHROPLASTY;  Surgeon: Erin SonsHarold Kernodle, MD;  Location: [facility] ORS;  Service: Orthopedics;  Laterality: Left;    SOCIAL HISTORY:  Social History   Substance Use Topics  . Smoking status: Former Smoker    Quit date: 10/23/1974  . Smokeless tobacco: Not on file  . Alcohol Use: No    FAMILY HISTORY:  Family History  Problem Relation Age of Onset  . Heart disease Mother   . Heart disease Father   . Cancer Sister   . Cancer Brother     DRUG ALLERGIES:  Allergies  Allergen Reactions  . Amoxicillin Rash  . Augmentin [Amoxicillin-Pot Clavulanate] Rash  . Ciprofloxacin Rash  . Clindamycin/Lincomycin Rash  . Dicyclomine Rash  . Doxycycline Rash  . Flagyl [Metronidazole] Rash  . Levaquin [Levofloxacin In D5w] Rash  . Septra [Sulfamethoxazole-Trimethoprim] Rash    REVIEW OF SYSTEMS:   CONSTITUTIONAL: No fever, positive for fatigue or weakness.  EYES: No blurred or double vision.  EARS, NOSE, AND THROAT: No tinnitus or ear pain.  RESPIRATORY: No cough, shortness of breath, wheezing or hemoptysis.  CARDIOVASCULAR: No chest pain, orthopnea, edema.  GASTROINTESTINAL: No nausea, vomiting, diarrhea or abdominal pain.  GENITOURINARY: No dysuria, hematuria.  ENDOCRINE: No polyuria, nocturia,  HEMATOLOGY: No anemia, easy bruising or bleeding SKIN: No rash or lesion. MUSCULOSKELETAL: No joint pain or arthritis.   NEUROLOGIC: No tingling, numbness, weakness.  PSYCHIATRY: No anxiety or depression.   MEDICATIONS AT HOME:  Prior to Admission medications   Medication Sig Start Date End Date Taking? Authorizing Provider  ALPRAZolam (XANAX) 0.25 MG tablet Take 0.25 mg by mouth 2 (two) times daily as  needed for anxiety or sleep.    Yes Historical Provider, MD  aspirin EC 81 MG tablet Take 81 mg by mouth daily.   Yes Historical Provider, MD  busPIRone (BUSPAR) 5 MG tablet Take 10 mg by mouth 2 (two) times daily.    Yes Historical Provider, MD  clotrimazole-betamethasone (LOTRISONE) cream Apply 1 application topically 2 (two) times daily as needed (for itching).   Yes Historical Provider, MD  diclofenac sodium (VOLTAREN) 1 % GEL Apply 2  g topically 4 (four) times daily as needed (for pain).    Yes Historical Provider, MD  digoxin (LANOXIN) 0.25 MG tablet Take 0.125 mg by mouth daily.    Yes Historical Provider, MD  diphenhydrAMINE (BENADRYL) 25 MG tablet Take 25 mg by mouth every 6 (six) hours as needed for itching.    Yes Historical Provider, MD  fluticasone (FLONASE) 50 MCG/ACT nasal spray Place 2 sprays into both nostrils at bedtime.   Yes Historical Provider, MD  furosemide (LASIX) 20 MG tablet Take 20 mg by mouth daily as needed for edema.   Yes Historical Provider, MD  loperamide (IMODIUM) 2 MG capsule Take 2 mg by mouth as needed for diarrhea or loose stools.    Yes Historical Provider, MD  loratadine (CLARITIN) 10 MG tablet Take 10 mg by mouth daily as needed for allergies.   Yes Historical Provider, MD  losartan-hydrochlorothiazide (HYZAAR) 100-12.5 MG per tablet Take 1 tablet by mouth daily.   Yes Historical Provider, MD  metoprolol succinate (TOPROL-XL) 50 MG 24 hr tablet Take 50 mg by mouth daily.    Yes Historical Provider, MD  montelukast (SINGULAIR) 10 MG tablet Take 10 mg by mouth at bedtime.   Yes Historical Provider, MD  raloxifene (EVISTA) 60 MG tablet Take 60 mg by mouth daily.   Yes Historical Provider, MD  enoxaparin (LOVENOX) 40 MG/0.4ML injection Inject 0.4 mLs (40 mg total) into the skin daily. Patient not taking: Reported on 04/01/2015 11/07/14   Anson Oregon, PA-C  oxyCODONE (OXY IR/ROXICODONE) 5 MG immediate release tablet Take 1-2 tablets (5-10 mg total) by mouth every 4 (four) hours as needed for breakthrough pain. Patient not taking: Reported on 04/01/2015 11/07/14   Anson Oregon, PA-C      PHYSICAL EXAMINATION:   VITAL SIGNS: Blood pressure 156/46, pulse 35, temperature 98.2 F (36.8 C), temperature source Oral, resp. rate 16, height  (1.499 m), weight 62.596 kg (138 lb), SpO2 100 %.  GENERAL:  81 y.o.-year-old patient lying in the bed with no acute distress.  EYES: Pupils  equal, round, reactive to light and accommodation. No scleral icterus. Extraocular muscles intact.  HEENT: Head atraumatic, normocephalic. Oropharynx and nasopharynx clear.  NECK:  Supple, no jugular venous distention. No thyroid enlargement, no tenderness.  LUNGS: Normal breath sounds bilaterally, no wheezing, rales,rhonchi or crepitation. No use of accessory muscles of respiration.  CARDIOVASCULAR: S1, S2 slow . No murmurs, rubs, or gallops.  ABDOMEN: Soft, nontender, nondistended. Bowel sounds present. No organomegaly or mass.  EXTREMITIES: No pedal edema, cyanosis, or clubbing.  NEUROLOGIC: Cranial nerves II through XII are intact. Muscle strength 5/5 in all extremities. Sensation intact. Gait not checked.  PSYCHIATRIC: The patient is alert and oriented x 3.  SKIN: No obvious rash, lesion, or ulcer.   LABORATORY PANEL:   CBC  Recent Labs Lab 04/01/15 1700  WBC 5.5  HGB 12.5  HCT 36.3  PLT 210  MCV 97.0  MCH 33.2  MCHC 34.3  RDW 12.8  LYMPHSABS 1.2  MONOABS 0.4  EOSABS 0.1  BASOSABS 0.0   ------------------------------------------------------------------------------------------------------------------  Chemistries   Recent Labs Lab 04/01/15 1700  NA 137  K 4.2  CL 105  CO2 24  GLUCOSE 125*  BUN 26*  CREATININE 0.91  CALCIUM 9.4   ------------------------------------------------------------------------------------------------------------------ estimated creatinine clearance is 33.7 mL/min (by C-G formula based on Cr of 0.91). ------------------------------------------------------------------------------------------------------------------ No results for input(s): TSH, T4TOTAL, T3FREE, THYROIDAB in the last 72 hours.  Invalid input(s): FREET3   Coagulation profile No results for input(s): INR, PROTIME in the last 168 hours. ------------------------------------------------------------------------------------------------------------------- No results for  input(s): DDIMER in the last 72 hours. -------------------------------------------------------------------------------------------------------------------  Cardiac Enzymes  Recent Labs Lab 04/01/15 1700  TROPONINI <0.03   ------------------------------------------------------------------------------------------------------------------ Invalid input(s): POCBNP  ---------------------------------------------------------------------------------------------------------------  Urinalysis    Component Value Date/Time   COLORURINE STRAW* 10/23/2014 1140   APPEARANCEUR CLEAR* 10/23/2014 1140   LABSPEC 1.006 10/23/2014 1140   PHURINE 7.0 10/23/2014 1140   GLUCOSEU NEGATIVE 10/23/2014 1140   HGBUR NEGATIVE 10/23/2014 1140   BILIRUBINUR NEGATIVE 10/23/2014 1140   KETONESUR NEGATIVE 10/23/2014 1140   PROTEINUR NEGATIVE 10/23/2014 1140   NITRITE NEGATIVE 10/23/2014 1140   LEUKOCYTESUR NEGATIVE 10/23/2014 1140     RADIOLOGY: Dg Chest 1 View  04/01/2015  CLINICAL DATA:  Patient has increasing generalized weakness for more than 1 week which has worsened today. Patient also has noticeable lower extremity swelling. Patient has HX of HTN and smoking. EXAM: CHEST  1 VIEW COMPARISON:  01/21/2008 FINDINGS: External pacing pad. Heart size upper limits normal for technique. Lungs clear. No pneumothorax. No effusion. Visualized skeletal structures are unremarkable. IMPRESSION: No acute cardiopulmonary disease. Electronically Signed   By: Corlis Leak M.D.   On: 04/01/2015 18:03    EKG:  Complete heart block with ventricular rate of 34  IMPRESSION AND PLAN:  * Complete heart block  Unknown reason at this point  TSH is not checked, I will added to today's laboratory samples.  She is hemodynamically stable otherwise so Dr. 5 to aggravate on monitoring her on telemetry.  Meanwhile will hold her digoxin and metoprolol, her digoxin level is within therapeutic range.  Further management per cardiology  team.  * Atrial fibrillation  Currently she is in complete heart block  Hold digoxin and metoprolol, she is not on any anticoagulation.  * Hypertension  Hold metoprolol, continue other non- rate affecting medications.  * COPD  No exacerbation symptoms  Continue fluticasone as she is taking and Singulair.  All the records are reviewed and case discussed with ED provider. Management plans discussed with the patient, family and they are in agreement.  CODE STATUS: Full    TOTAL CRITICAL CARE TIME TAKING CARE OF THIS PATIENT: 60 minutes.  Condition is critical because of heart block but as patient is currently stable we'll monitor on telemetry floor, however ICU is running full and if her condition get worse she might need to get transferred to another hospital. I informed about this to her daughters and patient herself they understand and agree with that.  Altamese Dilling M.D on 04/01/2015   Between 7am to 6pm - Pager - [phone]  After 6pm go to [email or web address] - password [email or web address]  [facility]  Office  [phone]  CC: Primary care physician; Lauro Regulus., MD   Note: This dictation was prepared with Dragon dictation along with smaller phrase technology. Any transcriptional errors that result from this process are unintentional.

## 2015-04-02 ENCOUNTER — Inpatient Hospital Stay
Admit: 2015-04-02 | Discharge: 2015-04-02 | Disposition: A | Discharge status: Home / Self Care | Payer: Commercial Managed Care - HMO | Attending: Internal Medicine | Admitting: Internal Medicine

## 2015-04-02 DIAGNOSIS — I459 Conduction disorder, unspecified: Secondary | ICD-10-CM | POA: Diagnosis present

## 2015-04-02 LAB — CBC
HEMATOCRIT: 28.7 % — AB (ref 35.0–47.0)
HEMOGLOBIN: 9.8 g/dL — AB (ref 12.0–16.0)
MCH: 33 pg (ref 26.0–34.0)
MCHC: 34.1 g/dL (ref 32.0–36.0)
MCV: 96.8 fL (ref 80.0–100.0)
Platelets: 167 10*3/uL (ref 150–440)
RBC: 2.97 MIL/uL — AB (ref 3.80–5.20)
RDW: 12.6 % (ref 11.5–14.5)
WBC: 3.9 10*3/uL (ref 3.6–11.0)

## 2015-04-02 LAB — BASIC METABOLIC PANEL
ANION GAP: 3 — AB (ref 5–15)
BUN: 25 mg/dL — ABNORMAL HIGH (ref 6–20)
CALCIUM: 8.5 mg/dL — AB (ref 8.9–10.3)
CO2: 24 mmol/L (ref 22–32)
Chloride: 110 mmol/L (ref 101–111)
Creatinine, Ser: 0.74 mg/dL (ref 0.44–1.00)
Glucose, Bld: 89 mg/dL (ref 65–99)
POTASSIUM: 3.8 mmol/L (ref 3.5–5.1)
Sodium: 137 mmol/L (ref 135–145)

## 2015-04-02 LAB — TROPONIN I: Troponin I: 0.03 ng/mL (ref <0.031)

## 2015-04-02 LAB — TSH: TSH: 4.713 u[IU]/mL — ABNORMAL HIGH (ref 0.350–4.500)

## 2015-04-02 MED ORDER — DICLOFENAC SODIUM 1 % TD GEL
2.0000 g | Freq: Four times a day (QID) | TRANSDERMAL | Status: DC
  Administered 2015-04-02 – 2015-04-04 (×6): 2 g via TOPICAL
  Filled 2015-04-02: qty 100

## 2015-04-02 MED ORDER — INFLUENZA VAC SPLIT QUAD 0.5 ML IM SUSY
0.5000 mL | PREFILLED_SYRINGE | INTRAMUSCULAR | Status: AC
  Administered 2015-04-03: 0.5 mL via INTRAMUSCULAR
  Filled 2015-04-02: qty 0.5

## 2015-04-02 NOTE — Progress Notes (Signed)
Dr. Seth BakeV paged: pt complaining of arthritic pain and no PRNs ordered. MD to put in orders.

## 2015-04-02 NOTE — Care Management (Signed)
Patient is going to require pacemaker.  Dr Lady GaryFath informs that there are concerns getting patient on the schedule today or tomorrow.  Dr Lady GaryFath will be checking with Dr Maisie Fushomas.  Parachos unable to schedule.   [facility] had [facility]ized [facility] but this is not in network with patient's insurance.

## 2015-04-02 NOTE — Progress Notes (Signed)
Pt admitted from ED with CHB in 30's, asymptomatic. Pt oriented to room, call bell within reach. Skin checked with Stevie KernL Miller, RN

## 2015-04-02 NOTE — Care Management Obs Status (Signed)
MEDICARE OBSERVATION STATUS NOTIFICATION   Patient Details  Name: Natalie KapurHelen S Schneider MRN: [id number] Date of Birth: [birth year]   Medicare Observation Status Notification Given:  Yes CODE 44 obsv NOTICE TO PATIENT ON UNIT.    Berna Bueheryl Tamica Covell, RN 04/02/2015, 7:36 AM

## 2015-04-02 NOTE — Progress Notes (Signed)
*  PRELIMINARY RESULTS* Echocardiogram 2D Echocardiogram has been performed.  Natalie Schneider 04/02/2015, 4:15 PM

## 2015-04-02 NOTE — Progress Notes (Signed)
[facility]  SUBJECTIVE: still weak and still with afib with complete heart block   Filed Vitals:   04/02/15 0704 04/02/15 0731 04/02/15 1028 04/02/15 1107  BP: 139/44 136/45 101/50 132/42  Pulse: 34 35    Temp: 98.5 F (36.9 C) 98.6 F (37 C)    TempSrc: Oral Oral    Resp: 20 18    Height:      Weight:      SpO2: 94% 97%      Intake/Output Summary (Last 24 hours) at 04/02/15 1110 Last data filed at 04/02/15 0931  Gross per 24 hour  Intake    100 ml  Output     50 ml  Net     50 ml    LABS: Basic Metabolic Panel:  Recent Labs  40/98/1111/16/16 1700 04/02/15 0243  NA 137 137  K 4.2 3.8  CL 105 110  CO2 24 24  GLUCOSE 125* 89  BUN 26* 25*  CREATININE 0.91 0.74  CALCIUM 9.4 8.5*   Liver Function Tests: No results for input(s): AST, ALT, ALKPHOS, BILITOT, PROT, ALBUMIN in the last 72 hours. No results for input(s): LIPASE, AMYLASE in the last 72 hours. CBC:  Recent Labs  04/01/15 1700 04/02/15 0243  WBC 5.5 3.9  NEUTROABS 3.7  --   HGB 12.5 9.8*  HCT 36.3 28.7*  MCV 97.0 96.8  PLT 210 167   Cardiac Enzymes:  Recent Labs  04/01/15 1700 04/01/15 2135 04/02/15 0243  TROPONINI <0.03 <0.03 0.03   BNP: Invalid input(s): POCBNP D-Dimer: No results for input(s): DDIMER in the last 72 hours. Hemoglobin A1C: No results for input(s): HGBA1C in the last 72 hours. Fasting Lipid Panel: No results for input(s): CHOL, HDL, LDLCALC, TRIG, CHOLHDL, LDLDIRECT in the last 72 hours. Thyroid Function Tests:  Recent Labs  04/02/15 0243  TSH 4.713*   Anemia Panel: No results for input(s): VITAMINB12, FOLATE, FERRITIN, TIBC, IRON, RETICCTPCT in the last 72 hours.   Physical Exam: Blood pressure 132/42, pulse 35, temperature 98.6 F (37 C), temperature source Oral, resp. rate 18, height 4\' 11"  (1.499 m), weight 62.596 kg (138 lb), SpO2 97 %.    General appearance: alert and cooperative Resp: clear to auscultation  bilaterally Cardio: irregularly irregular rhythm GI: soft, non-tender; bowel sounds normal; no masses,  no organomegaly Extremities: extremities normal, atraumatic, no cyanosis or edema Neurologic: Grossly normal  TELEMETRY: Reviewed telemetry pt in afib with complete heart block:  ASSESSMENT AND PLAN:  Principal Problem:   Complete heart block (HCC)-will remain off of all rate related meds. Will plan permenant pacemaker tomorrow with Dr. Gerre PebblesKevin Thomas Active Problems:   Heart block    Dalia HeadingFATH,Allina Riches A., MD, [facility] 04/02/2015 11:10 AM

## 2015-04-02 NOTE — Care Management (Signed)
Patient will [facility]

## 2015-04-02 NOTE — Progress Notes (Signed)
[facility] [place] [facility]   PATIENT NAME: Natalie Schneider    MR#:  [id number]  DATE OF BIRTH:  Dec 23, 1923  SUBJECTIVE:  CHIEF COMPLAINT:   Chief Complaint  Patient presents with  . Hypertension  . Bradycardia   patient is a 81 year old Caucasian female with past medical history significant for history of hypertension who presents to the hospital with complaints of weakness. Patient's family measured her blood pressure and her heart rate was noted to be low and so she was brought to emergency room for further evaluation where she was noted to have complete heart block with heart rate in 30s. Patient denies any chest pains. Cardiologist saw patient in consultation and felt that she would benefit from permanent pacemaker placement, likely tomorrow  Review of Systems  Constitutional: Negative for fever, chills and weight loss.  HENT: Negative for congestion.   Eyes: Negative for blurred vision and double vision.  Respiratory: Negative for cough, sputum production, shortness of breath and wheezing.   Cardiovascular: Negative for chest pain, palpitations, orthopnea, leg swelling and PND.  Gastrointestinal: Negative for nausea, vomiting, abdominal pain, diarrhea, constipation and blood in stool.  Genitourinary: Negative for dysuria, urgency, frequency and hematuria.  Musculoskeletal: Negative for falls.  Neurological: Negative for dizziness, tremors, focal weakness and headaches.  Endo/Heme/Allergies: Does not bruise/bleed easily.  Psychiatric/Behavioral: Negative for depression. The patient does not have insomnia.     VITAL SIGNS: Blood pressure 132/42, pulse 40, temperature 98.6 F (37 C), temperature source Oral, resp. rate 18, height 4\' 11"  (1.499 m), weight 62.596 kg (138 lb), SpO2 97 %.  PHYSICAL EXAMINATION:   GENERAL:  81 y.o.-year-old patient lying in the bed with no acute distress.  EYES: Pupils equal, round, reactive to light and  accommodation. No scleral icterus. Extraocular muscles intact.  HEENT: Head atraumatic, normocephalic. Oropharynx and nasopharynx clear.  NECK:  Supple, no jugular venous distention. No thyroid enlargement, no tenderness.  LUNGS: Normal breath sounds bilaterally, no wheezing, rales,rhonchi or crepitation. No use of accessory muscles of respiration.  CARDIOVASCULAR: S1, S2 regular. Slow. No murmurs, rubs, or gallops.  ABDOMEN: Soft, nontender, nondistended. Bowel sounds present. No organomegaly or mass.  EXTREMITIES: No pedal edema, cyanosis, or clubbing.  NEUROLOGIC: Cranial nerves II through XII are intact. Muscle strength 5/5 in all extremities. Sensation intact. Gait not checked.  PSYCHIATRIC: The patient is alert and oriented x 3.  SKIN: No obvious rash, lesion, or ulcer.   ORDERS/RESULTS REVIEWED:   CBC  Recent Labs Lab 04/01/15 1700 04/02/15 0243  WBC 5.5 3.9  HGB 12.5 9.8*  HCT 36.3 28.7*  PLT 210 167  MCV 97.0 96.8  MCH 33.2 33.0  MCHC 34.3 34.1  RDW 12.8 12.6  LYMPHSABS 1.2  --   MONOABS 0.4  --   EOSABS 0.1  --   BASOSABS 0.0  --    ------------------------------------------------------------------------------------------------------------------  Chemistries   Recent Labs Lab 04/01/15 1700 04/02/15 0243  NA 137 137  K 4.2 3.8  CL 105 110  CO2 24 24  GLUCOSE 125* 89  BUN 26* 25*  CREATININE 0.91 0.74  CALCIUM 9.4 8.5*   ------------------------------------------------------------------------------------------------------------------ estimated creatinine clearance is 38.4 mL/min (by C-G formula based on Cr of 0.74). ------------------------------------------------------------------------------------------------------------------  Recent Labs  04/02/15 0243  TSH 4.713*    Cardiac Enzymes  Recent Labs Lab 04/01/15 1700 04/01/15 2135 04/02/15 0243  TROPONINI <0.03 <0.03 0.03    ------------------------------------------------------------------------------------------------------------------ Invalid input(s): POCBNP ---------------------------------------------------------------------------------------------------------------  RADIOLOGY: Dg Chest 1 View  04/01/2015  CLINICAL DATA:  Patient has increasing generalized weakness for more than 1 week which has worsened today. Patient also has noticeable lower extremity swelling. Patient has HX of HTN and smoking. EXAM: CHEST  1 VIEW COMPARISON:  01/21/2008 FINDINGS: External pacing pad. Heart size upper limits normal for technique. Lungs clear. No pneumothorax. No effusion. Visualized skeletal structures are unremarkable. IMPRESSION: No acute cardiopulmonary disease. Electronically Signed   By: Corlis Leak M.D.   On: 04/01/2015 18:03    EKG:  Orders placed or performed during the hospital encounter of 04/01/15  . ED EKG  . ED EKG  . EKG 12-Lead  . EKG 12-Lead    ASSESSMENT AND PLAN:  Principal Problem:   Complete heart block (HCC) Active Problems:   Heart block 1. Complete heart block, supportive therapy for now, patient was seen by cardiologist and recommended permanent pacemaker placement tomorrow, off digoxin and metoprolol 2. Anemia, follow in the morning 3. Essential hypertension, well-controlled on current medications, off beta blocker 4. COPD, stable. Continue oxygen therapy as needed   Management plans discussed with the patient, family and they are in agreement.   DRUG ALLERGIES:  Allergies  Allergen Reactions  . Amoxicillin Rash  . Augmentin [Amoxicillin-Pot Clavulanate] Rash  . Ciprofloxacin Rash  . Clindamycin/Lincomycin Rash  . Dicyclomine Rash  . Doxycycline Rash  . Flagyl [Metronidazole] Rash  . Levaquin [Levofloxacin In D5w] Rash  . Septra [Sulfamethoxazole-Trimethoprim] Rash    CODE STATUS:     Code Status Orders        Start     Ordered   04/01/15 2231  Full code    Continuous     04/01/15 2230      TOTAL TIME TAKING CARE OF THIS PATIENT: 35 minutes.    Katharina Caper M.D on 04/02/2015 at 5:02 PM  Between 7am to 6pm - Pager - [phone]  After 6pm go to [email or web address] - password [email or web address] [facility]  [place] [facility]  Office  [phone]  CC: Primary care physician; Lauro Regulus., MD

## 2015-04-03 ENCOUNTER — Inpatient Hospital Stay: Payer: Commercial Managed Care - HMO

## 2015-04-03 ENCOUNTER — Encounter: Payer: Self-pay | Admitting: Anesthesiology

## 2015-04-03 ENCOUNTER — Inpatient Hospital Stay: Payer: Commercial Managed Care - HMO | Admitting: Anesthesiology

## 2015-04-03 ENCOUNTER — Encounter
Admission: EM | Disposition: A | Discharge status: Home / Self Care | Payer: Self-pay | Source: Home / Self Care | Attending: Internal Medicine

## 2015-04-03 DIAGNOSIS — R05 Cough: Secondary | ICD-10-CM | POA: Diagnosis not present

## 2015-04-03 DIAGNOSIS — I482 Chronic atrial fibrillation: Secondary | ICD-10-CM | POA: Diagnosis not present

## 2015-04-03 DIAGNOSIS — K219 Gastro-esophageal reflux disease without esophagitis: Secondary | ICD-10-CM | POA: Diagnosis not present

## 2015-04-03 DIAGNOSIS — D649 Anemia, unspecified: Secondary | ICD-10-CM | POA: Diagnosis not present

## 2015-04-03 DIAGNOSIS — R001 Bradycardia, unspecified: Secondary | ICD-10-CM | POA: Diagnosis not present

## 2015-04-03 DIAGNOSIS — J449 Chronic obstructive pulmonary disease, unspecified: Secondary | ICD-10-CM | POA: Diagnosis not present

## 2015-04-03 DIAGNOSIS — Z87891 Personal history of nicotine dependence: Secondary | ICD-10-CM | POA: Diagnosis not present

## 2015-04-03 DIAGNOSIS — I10 Essential (primary) hypertension: Secondary | ICD-10-CM | POA: Diagnosis not present

## 2015-04-03 DIAGNOSIS — I442 Atrioventricular block, complete: Secondary | ICD-10-CM | POA: Diagnosis not present

## 2015-04-03 HISTORY — PX: PACEMAKER INSERTION: SHX728

## 2015-04-03 SURGERY — INSERTION, CARDIAC PACEMAKER
Anesthesia: Monitor Anesthesia Care | Laterality: Left | Wound class: Clean

## 2015-04-03 MED ORDER — SODIUM CHLORIDE 0.9 % IR SOLN
Status: DC | PRN
  Administered 2015-04-03: 100 mL

## 2015-04-03 MED ORDER — ACETAMINOPHEN 325 MG PO TABS
325.0000 mg | ORAL_TABLET | ORAL | Status: DC | PRN
  Administered 2015-04-03: 650 mg via ORAL
  Filled 2015-04-03: qty 2

## 2015-04-03 MED ORDER — LACTATED RINGERS IV SOLN
INTRAVENOUS | Status: DC | PRN
  Administered 2015-04-03: 08:00:00 via INTRAVENOUS

## 2015-04-03 MED ORDER — MIDAZOLAM HCL 2 MG/2ML IJ SOLN
INTRAMUSCULAR | Status: DC | PRN
  Administered 2015-04-03 (×4): 0.5 mg via INTRAVENOUS

## 2015-04-03 MED ORDER — LIDOCAINE 1 % OPTIME INJ - NO CHARGE
INTRAMUSCULAR | Status: DC | PRN
  Administered 2015-04-03: 17 mL via INTRADERMAL

## 2015-04-03 MED ORDER — ONDANSETRON HCL 4 MG/2ML IJ SOLN: 4.0000 mg | Freq: Four times a day (QID) | INTRAMUSCULAR | Status: DC | PRN

## 2015-04-03 MED ORDER — EPHEDRINE SULFATE 50 MG/ML IJ SOLN
INTRAMUSCULAR | Status: DC | PRN
  Administered 2015-04-03: 5 mg via INTRAVENOUS

## 2015-04-03 MED ORDER — HEPARIN SODIUM (PORCINE) 1000 UNIT/ML IJ SOLN
INTRAMUSCULAR | Status: DC | PRN
  Administered 2015-04-03: 17 mL via INTRAMUSCULAR

## 2015-04-03 MED ORDER — ONDANSETRON HCL 4 MG/2ML IJ SOLN: 4.0000 mg | Freq: Once | INTRAMUSCULAR | Status: DC | PRN

## 2015-04-03 MED ORDER — FENTANYL CITRATE (PF) 100 MCG/2ML IJ SOLN: 25.0000 ug | INTRAMUSCULAR | Status: DC | PRN

## 2015-04-03 MED ORDER — FENTANYL CITRATE (PF) 100 MCG/2ML IJ SOLN
INTRAMUSCULAR | Status: DC | PRN
  Administered 2015-04-03 (×4): 25 ug via INTRAVENOUS

## 2015-04-03 MED ORDER — PROPOFOL 10 MG/ML IV BOLUS
INTRAVENOUS | Status: DC | PRN
  Administered 2015-04-03: 30 mg via INTRAVENOUS
  Administered 2015-04-03 (×2): 10 mg via INTRAVENOUS
  Administered 2015-04-03 (×2): 20 mg via INTRAVENOUS
  Administered 2015-04-03: 10 mg via INTRAVENOUS
  Administered 2015-04-03 (×2): 20 mg via INTRAVENOUS

## 2015-04-03 SURGICAL SUPPLY — 43 items
BAG DECANTER FOR FLEXI CONT (MISCELLANEOUS) ×3 IMPLANT
BLADE SURG SZ10 CARB STEEL (BLADE) ×3 IMPLANT
BRUSH SCRUB 4% CHG (MISCELLANEOUS) ×3 IMPLANT
CABLE SURG 12 DISP A/V CHANNEL (MISCELLANEOUS) ×3 IMPLANT
CANISTER SUCT 1200ML W/VALVE (MISCELLANEOUS) ×3 IMPLANT
CHLORAPREP W/TINT 26ML (MISCELLANEOUS) ×3 IMPLANT
CLOSURE WOUND 1/2 X4 (GAUZE/BANDAGES/DRESSINGS) ×1
COVER LIGHT HANDLE STERIS (MISCELLANEOUS) ×6 IMPLANT
DRAPE C-ARM XRAY 36X54 (DRAPES) ×3 IMPLANT
DRESSING TELFA 4X3 1S ST N-ADH (GAUZE/BANDAGES/DRESSINGS) ×3 IMPLANT
DRSG TEGADERM 4X4.75 (GAUZE/BANDAGES/DRESSINGS) ×3 IMPLANT
GLOVE BIO SURGEON STRL SZ7.5 (GLOVE) ×3 IMPLANT
GOWN STRL REUS W/ TWL LRG LVL3 (GOWN DISPOSABLE) ×1 IMPLANT
GOWN STRL REUS W/TWL LRG LVL3 (GOWN DISPOSABLE) ×2
IMMOBILIZER SHDR MD LX WHT (SOFTGOODS) ×3 IMPLANT
INTRO PACEMKR SHEATH II 7FR (MISCELLANEOUS) ×3
INTRODUCER PACEMKR SHTH II 7FR (MISCELLANEOUS) ×1 IMPLANT
IV NS 1000ML (IV SOLUTION) ×2
IV NS 1000ML BAXH (IV SOLUTION) ×1 IMPLANT
KIT RM TURNOVER STRD PROC AR (KITS) ×3 IMPLANT
LABEL OR SOLS (LABEL) ×3 IMPLANT
LEAD INTRODUCER 7FR 23CM (INTRODUCER) ×3 IMPLANT
MARKER SKIN W/RULER 31145785 (MISCELLANEOUS) ×3 IMPLANT
MICROPUNCTURE 5FR NT-U-SST (MISCELLANEOUS) ×3
NDL SAFETY 18GX1.5 (NEEDLE) ×3 IMPLANT
NEEDLE HYPO 25X1 1.5 SAFETY (NEEDLE) ×3 IMPLANT
NEEDLE SPNL 18GX3.5 QUINCKE PK (NEEDLE) ×3 IMPLANT
NS IRRIG 500ML POUR BTL (IV SOLUTION) ×3 IMPLANT
PACEMAKER LEAD ATRL BIP (MISCELLANEOUS) ×3 IMPLANT
PACEMAKER LEAD VENTRIC (MISCELLANEOUS) ×3 IMPLANT
PACK PACE INSERTION (MISCELLANEOUS) ×3 IMPLANT
PAD GROUND ADULT SPLIT (MISCELLANEOUS) ×3 IMPLANT
PAD STATPAD (MISCELLANEOUS) ×3 IMPLANT
PPM ADVISA MRI DR A2DR01 (Pacemaker) ×3 IMPLANT
SET MICROPUNCTURE 5FR NT-U-SST (MISCELLANEOUS) ×1 IMPLANT
SLING ARM LRG DEEP (SOFTGOODS) ×3 IMPLANT
STRIP CLOSURE SKIN 1/2X4 (GAUZE/BANDAGES/DRESSINGS) ×2 IMPLANT
SUT DVC V-LOC 4-0 90 CLR P-12 (SUTURE) ×3
SUT SILK 0 SH 30 (SUTURE) ×9 IMPLANT
SUT VIC AB 3-0 SH 27 (SUTURE) ×2
SUT VIC AB 3-0 SH 27X BRD (SUTURE) ×1 IMPLANT
SUT VIC AB 4-0 PS2 18 (SUTURE) ×3 IMPLANT
SUTURE DVC V-LC4-0 90 CLR P-12 (SUTURE) ×1 IMPLANT

## 2015-04-03 NOTE — Transfer of Care (Signed)
Immediate Anesthesia Transfer of Care Note  Patient: Lilian KapurHelen S Strollo  Procedure(s) Performed: Procedure(s): INSERTION PACEMAKER (Left)  Patient Location: PACU  Anesthesia Type:MAC  Level of Consciousness: awake, alert  and oriented  Airway & Oxygen Therapy: Patient Spontanous Breathing and Patient connected to nasal cannula oxygen  Post-op Assessment: Report given to RN and Post -op Vital signs reviewed and stable  Post vital signs: Reviewed and stable  Last Vitals:  Filed Vitals:   04/03/15 0951  BP: 139/59  Pulse: 78  Temp: 36.3 C  Resp: 12    Complications: No apparent anesthesia complications

## 2015-04-03 NOTE — Progress Notes (Signed)
Pre procedure evaluation  81 y/o with chronic afib admitted with symptomatic afib. Patient was seen and evaluated by me. Risks and benefits of the procedure discussed and she would ljke to proceed with single chamber pacemaker placement. [facility] and this will need to be addressed prior to discharge.  Heent unrevealing Lungs CTA  CV irregular sem lsb 2/6  Imp: Symptomatic bradycardia in setting of chronic afib  plan for single chamber pacemaker.

## 2015-04-03 NOTE — Progress Notes (Addendum)
Pt returned from PACU with RN. No complaints of pain, site WNL, tiny amount of dried drainage, V-paced on monitor HR 70s-80s. Per PACU RN, MD did not want arm immobilizer in place; CXR will be checked in the AM. Pt A&O, asking for food, diet ordered per Dr. Seth BakeV. Will continue to monitor.

## 2015-04-03 NOTE — Care Management Important Message (Signed)
Important Message  Patient Details  Name: Natalie KapurHelen S Schneider MRN: [id number] Date of Birth: 1923-11-14   Medicare Important Message Given:  Yes    Eber HongGreene, Camaron Cammack R, RN 04/03/2015, 5:43 PM

## 2015-04-03 NOTE — CV Procedure (Signed)
Natalie KapurHelen S Schneider [id number]  [id number]  Preop Dx:av block complete Postop Dx same  Procedure: dual chamber pacemaker insertion  After routine prep and drape, lidocaine was infiltrated in the prepectoral subclavicular region on the left side an incision was made and carried down to layer of  the prepectoral fascia using electrocautery and sharp dissection;  a pocket was formed similarly. Hemostasis was obtained.  After this, we turned our attention to gaining access to the extrathoracic,left subclavian vein. This was accomplished with difficulty and ultimately required venogram, once the vein was accessed without the aspiration of air or puncture of the artery. 1 separate venipunctures were accomplished; guidewires were placed and retained through 9 french [place] sheath  was placed through which were passed a   ventricular lead, serial number [id number] and a  atrial lead, serial number [id number].  The ventricular lead was manipulated to the right ventricular apex where the bipolar R wave was 3.1, the pacing impedance was 650,  the threshold was 0.7 V  @ 0.5 msec .  Marland Kitchen.  The right atrial lead was manipulated to the right atrial appendage where the bipolar P-wave was 1.4, the pacing impedance was 544, the threshold was 1.0 V @ 0.4 msec.    The leads were affixed to the prepectoral fascia and attached to a  Medtronic pulse generator.  Serial number [id number]  Hemostasis was obtained. The pocket was copiously irrigated with antibiotic containing saline solution. The leads and the pulse generator were placed in the pocket and affixed to the prepectoral fascia. The wound iwas then closed in 3 layers in normal fashion 2.0 3.0 vicryl. The wound was washed dried and a 4.0 v lock was applied.  Needle count, sponge count  and instrument counts were correct at the end of the procedure. The patient tolerated the procedure without apparent complication.  EBL  Minimal

## 2015-04-03 NOTE — Progress Notes (Signed)
[facility] [place] [facility]   PATIENT NAME: Natalie Schneider    MR#:  [id number]  DATE OF BIRTH:  [birth year]  SUBJECTIVE:  CHIEF COMPLAINT:   Chief Complaint  Patient presents with  . Hypertension  . Bradycardia   patient is a 81 year old Caucasian female with past medical history significant for history of hypertension who presents to the hospital with complaints of weakness. Patient's family measured her blood pressure and her heart rate was noted to be low and so she was brought to emergency room for further evaluation where she was noted to have complete heart block with heart rate in 30s. Patient denied any chest pains. Cardiologist saw patient in consultation and felt that she would benefit from permanent pacemaker placement, which was done today 18th of November 2016. Patient feels comfortable. No chest pains. Feels energetic. Heart rate is in the 70s to 80s. Blood pressure a 130  Review of Systems  Constitutional: Negative for fever, chills and weight loss.  HENT: Negative for congestion.   Eyes: Negative for blurred vision and double vision.  Respiratory: Negative for cough, sputum production, shortness of breath and wheezing.   Cardiovascular: Negative for chest pain, palpitations, orthopnea, leg swelling and PND.  Gastrointestinal: Negative for nausea, vomiting, abdominal pain, diarrhea, constipation and blood in stool.  Genitourinary: Negative for dysuria, urgency, frequency and hematuria.  Musculoskeletal: Negative for falls.  Neurological: Negative for dizziness, tremors, focal weakness and headaches.  Endo/Heme/Allergies: Does not bruise/bleed easily.  Psychiatric/Behavioral: Negative for depression. The patient does not have insomnia.     VITAL SIGNS: Blood pressure 131/50, pulse 79, temperature 98.3 F (36.8 C), temperature source Oral, resp. rate 18, height 4\' 11"  (1.499 m), weight 62.596 kg (138 lb), SpO2 92 %.  PHYSICAL EXAMINATION:    GENERAL:  81 y.o.-year-old patient lying in the bed with no acute distress.  EYES: Pupils equal, round, reactive to light and accommodation. No scleral icterus. Extraocular muscles intact.  HEENT: Head atraumatic, normocephalic. Oropharynx and nasopharynx clear.  NECK:  Supple, no jugular venous distention. No thyroid enlargement, no tenderness.  LUNGS: Normal breath sounds bilaterally, no wheezing, rales,rhonchi or crepitation. No use of accessory muscles of respiration.  CARDIOVASCULAR: S1, S2 regular. . No murmurs, rubs, or gallops.  ABDOMEN: Soft, nontender, nondistended. Bowel sounds present. No organomegaly or mass.  EXTREMITIES: No pedal edema, cyanosis, or clubbing.  NEUROLOGIC: Cranial nerves II through XII are intact. Muscle strength 5/5 in all extremities. Sensation intact. Gait not checked.  PSYCHIATRIC: The patient is alert and oriented x 3.  SKIN: No obvious rash, lesion, or ulcer.   ORDERS/RESULTS REVIEWED:   CBC  Recent Labs Lab 04/01/15 1700 04/02/15 0243  WBC 5.5 3.9  HGB 12.5 9.8*  HCT 36.3 28.7*  PLT 210 167  MCV 97.0 96.8  MCH 33.2 33.0  MCHC 34.3 34.1  RDW 12.8 12.6  LYMPHSABS 1.2  --   MONOABS 0.4  --   EOSABS 0.1  --   BASOSABS 0.0  --    ------------------------------------------------------------------------------------------------------------------  Chemistries   Recent Labs Lab 04/01/15 1700 04/02/15 0243  NA 137 137  K 4.2 3.8  CL 105 110  CO2 24 24  GLUCOSE 125* 89  BUN 26* 25*  CREATININE 0.91 0.74  CALCIUM 9.4 8.5*   ------------------------------------------------------------------------------------------------------------------ estimated creatinine clearance is 38.4 mL/min (by C-G formula based on Cr of 0.74). ------------------------------------------------------------------------------------------------------------------  Recent Labs  04/02/15 0243  TSH 4.713*    Cardiac Enzymes  Recent Labs Lab  04/01/15 1700  04/01/15 2135 04/02/15 0243  TROPONINI <0.03 <0.03 0.03   ------------------------------------------------------------------------------------------------------------------ Invalid input(s): POCBNP ---------------------------------------------------------------------------------------------------------------  RADIOLOGY: Dg Chest 1 View  04/01/2015  CLINICAL DATA:  Patient has increasing generalized weakness for more than 1 week which has worsened today. Patient also has noticeable lower extremity swelling. Patient has HX of HTN and smoking. EXAM: CHEST  1 VIEW COMPARISON:  01/21/2008 FINDINGS: External pacing pad. Heart size upper limits normal for technique. Lungs clear. No pneumothorax. No effusion. Visualized skeletal structures are unremarkable. IMPRESSION: No acute cardiopulmonary disease. Electronically Signed   By: Corlis Leak M.D.   On: 04/01/2015 18:03   Dg C-arm 61-120 Min-no Report  04/03/2015  CLINICAL DATA: Pacemaker insertion C-ARM 61-120 MINUTES Fluoroscopy was utilized by the requesting physician.  No radiographic interpretation.    EKG:  Orders placed or performed during the hospital encounter of 04/01/15  . ED EKG  . ED EKG  . EKG 12-Lead  . EKG 12-Lead  . EKG 12-Lead in am (before 8am)    ASSESSMENT AND PLAN:  Principal Problem:   Complete heart block (HCC) Active Problems:   Heart block 1. Complete heart block, status post permanent pacemaker placement 18th of November 2016 , may resume digoxin and metoprolol if cardiologist recommends it 2. Anemia, follow in the morning 3. Essential hypertension, well-controlled on current medications, off beta blocker, may restart beta blocker now since she has permanent pacemaker. Blood pressure, however, remains well controlled and no need for beta blocker 4. COPD, stable. Continue oxygen therapy as needed   Management plans discussed with the patient, family and they are in agreement.   DRUG ALLERGIES:  Allergies   Allergen Reactions  . Amoxicillin Rash  . Augmentin [Amoxicillin-Pot Clavulanate] Rash  . Ciprofloxacin Rash  . Clindamycin/Lincomycin Rash  . Dicyclomine Rash  . Doxycycline Rash  . Flagyl [Metronidazole] Rash  . Levaquin [Levofloxacin In D5w] Rash  . Septra [Sulfamethoxazole-Trimethoprim] Rash    CODE STATUS:     Code Status Orders        Start     Ordered   04/01/15 2231  Full code   Continuous     04/01/15 2230      TOTAL TIME TAKING CARE OF THIS PATIENT: 35 minutes.    Katharina Caper M.D on 04/03/2015 at 3:31 PM  Between 7am to 6pm - Pager - [phone]  After 6pm go to [email or web address] - password [email or web address] [facility]  [facility]  Office  [phone]  CC: Primary care physician; Lauro Regulus., MD

## 2015-04-03 NOTE — Anesthesia Preprocedure Evaluation (Signed)
Anesthesia Evaluation  Patient identified by MRN, date of birth, ID band Patient awake    Reviewed: Allergy & Precautions, NPO status , Patient's Chart, lab work & pertinent test results, reviewed documented beta blocker date and time   Airway Mallampati: III  TM Distance: <3 FB Neck ROM: Limited    Dental  (+) Chipped   Pulmonary COPD,  COPD inhaler, former smoker,    Pulmonary exam normal breath sounds clear to auscultation       Cardiovascular hypertension, Pt. on medications and Pt. on home beta blockers + dysrhythmias Atrial Fibrillation  Rhythm:Irregular Rate:Abnormal  Complete heart block   Neuro/Psych Anxiety negative neurological ROS     GI/Hepatic Neg liver ROS, GERD  Medicated and Controlled,  Endo/Other  negative endocrine ROS  Renal/GU negative Renal ROS     Musculoskeletal  (+) Arthritis , Osteoarthritis,    Abdominal Normal abdominal exam  (+)   Peds  Hematology negative hematology ROS (+)   Anesthesia Other Findings   Reproductive/Obstetrics                             Anesthesia Physical Anesthesia Plan  ASA: IV  Anesthesia Plan: MAC   Post-op Pain Management:    Induction: Intravenous  Airway Management Planned: Nasal Cannula  Additional Equipment:   Intra-op Plan:   Post-operative Plan:   Informed Consent: I have reviewed the patients History and Physical, chart, labs and discussed the procedure including the risks, benefits and alternatives for the proposed anesthesia with the patient or authorized representative who has indicated his/her understanding and acceptance.   Dental advisory given  Plan Discussed with: CRNA and Surgeon  Anesthesia Plan Comments:         Anesthesia Quick Evaluation

## 2015-04-04 ENCOUNTER — Inpatient Hospital Stay: Payer: Commercial Managed Care - HMO

## 2015-04-04 MED ORDER — ACETAMINOPHEN 325 MG PO TABS: 650.0000 mg | ORAL_TABLET | ORAL | Status: AC | PRN

## 2015-04-04 NOTE — Evaluation (Signed)
Physical Therapy Evaluation Patient Details Name: Natalie Schneider MRN: [id number] DOB: 11-21-1923 Today's Date: 04/04/2015   History of Present Illness  pt presented to ER with bradycardia. had pacemaker placement 04/03/15. pt reports feeling much better. pt reports no falls at home, but a couple "near missess" walking with the cane. pt lives at home with her son who works often.   Clinical Impression  Pt presents s/p pacemaker placement 04/03/15. Pt demonstrates independent bed mobility and transfers and was able to ambulate x 238ft using RW without any evidence of LOB, cardiac issues, SOB. Pt demonstrates mild LE and balance deficits which may be benefited from outpatient PT referral. Pt is not in need of further skilled PT services at this time for this admission.     Follow Up Recommendations Outpatient PT (for balance )    Equipment Recommendations   (pt has RW and SPC)    Recommendations for Other Services       Precautions / Restrictions        Mobility  Bed Mobility Overal bed mobility: Independent                Transfers Overall transfer level: Independent Equipment used: Standard walker      pt transferred from toilet><standing using RW and grab bar. Pt urinated. SBA for safety only in the restroom.        General transfer comment: pt performed indepenent sit to stand with RW. pt had proper hand placement needing no cues.   Ambulation/Gait Ambulation/Gait assistance: Modified independent (Device/Increase time) Ambulation Distance (Feet): 250 Feet Assistive device: Rolling walker (2 wheeled) (pt felt "more comfortable" with RW vs cane) Gait Pattern/deviations: Step-through pattern   Gait velocity interpretation: >2.62 ft/sec, indicative of independent community ambulator General Gait Details: pt demonstrated steady reciprocal gait pattern with RW, with SBA. pt had no SOB dizziness or c/o fatigue. pt was a safe ambuator using RW which she had at home. O2  and HR remaied steady. HR 86BPM  Stairs            Wheelchair Mobility    Modified Rankin (Stroke Patients Only)       Balance Overall balance assessment: Modified Independent                                           Pertinent Vitals/Pain Pain Assessment: No/denies pain    Home Living Family/patient expects to be discharged to:: Private residence Living Arrangements: Children Available Help at Discharge: Family Type of Home: House Home Access: Stairs to enter Entrance Stairs-Rails: Right Entrance Stairs-Number of Steps: 3 Home Layout: One level Home Equipment: Environmental consultant - 2 wheels;Cane - single point      Prior Function Level of Independence: Independent with assistive device(s)         Comments: Indep with household/community activities with [employer]; enjoys working outside in garden.  Denies fall history.     Hand Dominance        Extremity/Trunk Assessment   Upper Extremity Assessment: Overall WFL for tasks assessed           Lower Extremity Assessment: Overall WFL for tasks assessed         Communication   Communication: No difficulties  Cognition Arousal/Alertness: Awake/alert Behavior During Therapy: WFL for tasks assessed/performed Overall Cognitive Status: Within Functional Limits for tasks assessed  General Comments General comments (skin integrity, edema, etc.): pt demosntrated mild balance deficits. + [place]    Exercises        Assessment/Plan    PT Assessment Patent does not need any further PT services  PT Diagnosis Generalized weakness   PT Problem List    PT Treatment Interventions     PT Goals (Current goals can be found in the Care Plan section) Acute Rehab PT Goals Patient Stated Goal: go home PT Goal Formulation: With patient Time For Goal Achievement: 04/11/15 Potential to Achieve Goals: Good    Frequency     Barriers to discharge        Co-evaluation                End of Session Equipment Utilized During Treatment: Gait belt Activity Tolerance: Patient tolerated treatment well Patient left: in bed;with call bell/phone within reach;with bed alarm set           Time: 1050-1110 PT Time Calculation (min) (ACUTE ONLY): 20 min   Charges:   PT Evaluation $Initial PT Evaluation Tier I: 1 Procedure     PT G Codes:        [name] 04/04/2015, 11:21 AM

## 2015-04-04 NOTE — Discharge Summary (Signed)
Natalie KapurHelen S Schneider, 81 y.o., DOB 03-06-1924, MRN [id number]. Admission date: 04/01/2015 Discharge Date 04/04/2015 Primary MD Lauro RegulusANDERSON,MARSHALL W., MD Admitting Physician Altamese DillingVaibhavkumar Vachhani, MD  Admission Diagnosis  Complete heart block (HCC) [I44.2] Bradycardia [R00.1]  Discharge Diagnosis   Principal Problem:   Complete heart block [facility](HCC) status post pacemaker placement Accelerated hypertension GERD  Osteoarthritis Chronic cough A. fib COPD     Hospital Course  Natalie Schneider is a 81 y.o. female with a history of atrial fibrillation on digoxin who is presenting today with "feeling lousy and weak." Patient was noted to have third-degree heart block was seen by cardiology. She underwent single-chamber pacemaker placement. Patient tolerated the procedure well and is doing well and is stable for discharge. With outpatient cardiology follow-up.           Consults  cardiology  Significant Tests:  See full reports for all details      Dg Chest 1 View  04/04/2015  CLINICAL DATA:  Status post pacemaker insertion yesterday. EXAM: CHEST 1 VIEW COMPARISON:  04/01/2015 FINDINGS: Left anterior chest wall sequential pacemaker has been placed since the prior study. Leads project over the right atrium and right ventricle. No pneumothorax. Lungs are hyperexpanded but clear. Cardiac silhouette is normal in size. No mediastinal or hilar masses or evidence of adenopathy. The bony thorax is demineralized. IMPRESSION: 1. Left anterior chest wall pacemaker is well positioned on this single frontal view. 2. No acute cardiopulmonary disease.  No pneumothorax. Electronically Signed   By: Amie Portlandavid  Ormond M.D.   On: 04/04/2015 10:36   Dg Chest 1 View  04/01/2015  CLINICAL DATA:  Patient has increasing generalized weakness for more than 1 week which has worsened today. Patient also has noticeable lower extremity swelling. Patient has HX of HTN and smoking. EXAM: CHEST  1 VIEW COMPARISON:  01/21/2008  FINDINGS: External pacing pad. Heart size upper limits normal for technique. Lungs clear. No pneumothorax. No effusion. Visualized skeletal structures are unremarkable. IMPRESSION: No acute cardiopulmonary disease. Electronically Signed   By: Corlis Leak  Hassell M.D.   On: 04/01/2015 18:03   Dg C-arm 61-120 Min-no Report  04/03/2015  CLINICAL DATA: Pacemaker insertion C-ARM 61-120 MINUTES Fluoroscopy was utilized by the requesting physician.  No radiographic interpretation.       Today   Subjective:   Natalie Schneider feels well denies any complaints Blood pressure 142/66, pulse 86, temperature 97.5 F (36.4 C), temperature source Oral, resp. rate 18, height 4\' 11"  (1.499 m), weight 62.596 kg (138 lb), SpO2 95 %.  .  Intake/Output Summary (Last 24 hours) at 04/04/15 1507 Last data filed at 04/04/15 0744  Gross per 24 hour  Intake    120 ml  Output    400 ml  Net   -280 ml    Exam VITAL SIGNS: Blood pressure 142/66, pulse 86, temperature 97.5 F (36.4 C), temperature source Oral, resp. rate 18, height 4\' 11"  (1.499 m), weight 62.596 kg (138 lb), SpO2 95 %.  GENERAL:  81 y.o.-year-old patient lying in the bed with no acute distress.  EYES: Pupils equal, round, reactive to light and accommodation. No scleral icterus. Extraocular muscles intact.  HEENT: Head atraumatic, normocephalic. Oropharynx and nasopharynx clear.  NECK:  Supple, no jugular venous distention. No thyroid enlargement, no tenderness.  LUNGS: Normal breath sounds bilaterally, no wheezing, rales,rhonchi or crepitation. No use of accessory muscles of respiration.  CARDIOVASCULAR: S1, S2 normal. No murmurs, rubs, or gallops.  ABDOMEN: Soft, nontender, nondistended. Bowel sounds present. No organomegaly  or mass.  EXTREMITIES: No pedal edema, cyanosis, or clubbing.  NEUROLOGIC: Cranial nerves II through XII are intact. Muscle strength 5/5 in all extremities. Sensation intact. Gait not checked.  PSYCHIATRIC: The patient is alert and  oriented x 3.  SKIN: No obvious rash, lesion, or ulcer.   Data Review     CBC w Diff: Lab Results  Component Value Date   WBC 3.9 04/02/2015   HGB 9.8* 04/02/2015   HCT 28.7* 04/02/2015   PLT 167 04/02/2015   LYMPHOPCT 22 04/01/2015   MONOPCT 8 04/01/2015   EOSPCT 2 04/01/2015   BASOPCT 1 04/01/2015   CMP: Lab Results  Component Value Date   NA 137 04/02/2015   K 3.8 04/02/2015   CL 110 04/02/2015   CO2 24 04/02/2015   BUN 25* 04/02/2015   CREATININE 0.74 04/02/2015  .  Micro Results No results found for this or any previous visit (from the past 240 hour(s)).         Follow-up Information    Follow up with Lauro Regulus., MD In 7 days.   Specialty:  Internal Medicine   Contact information:   [address] [facility] [place] [address] [phone]       Follow up with Dalia Heading., MD In 7 days.   Specialty:  Cardiology   Contact information:   [address] [place] [address] [phone]       Discharge Medications     Medication List    STOP taking these medications        enoxaparin 40 MG/0.4ML injection  Commonly known as:  LOVENOX     oxyCODONE 5 MG immediate release tablet  Commonly known as:  Oxy IR/ROXICODONE      TAKE these medications        acetaminophen 325 MG tablet  Commonly known as:  TYLENOL  Take 2 tablets (650 mg total) by mouth every 4 (four) hours as needed for mild pain.     ALPRAZolam 0.25 MG tablet  Commonly known as:  XANAX  Take 0.25 mg by mouth 2 (two) times daily as needed for anxiety or sleep.     aspirin EC 81 MG tablet  Take 81 mg by mouth daily.     busPIRone 5 MG tablet  Commonly known as:  BUSPAR  Take 10 mg by mouth 2 (two) times daily.     diclofenac sodium 1 % Gel  Commonly known as:  VOLTAREN  Apply 2 g topically 4 (four) times daily as needed (for pain).     digoxin 0.25 MG tablet  Commonly known as:  LANOXIN  Take 0.125 mg by mouth daily.      diphenhydrAMINE 25 MG tablet  Commonly known as:  BENADRYL  Take 25 mg by mouth every 6 (six) hours as needed for itching.     fluticasone 50 MCG/ACT nasal spray  Commonly known as:  FLONASE  Place 2 sprays into both nostrils at bedtime.     furosemide 20 MG tablet  Commonly known as:  LASIX  Take 20 mg by mouth daily as needed for edema.     loperamide 2 MG capsule  Commonly known as:  IMODIUM  Take 2 mg by mouth as needed for diarrhea or loose stools.     loratadine 10 MG tablet  Commonly known as:  CLARITIN  Take 10 mg by mouth daily as needed for allergies.     losartan-hydrochlorothiazide 100-12.5 MG tablet  Commonly known as:  HYZAAR  Take 1 tablet by mouth daily.     LOTRISONE cream  Generic drug:  clotrimazole-betamethasone  Apply 1 application topically 2 (two) times daily as needed (for itching).     metoprolol succinate 50 MG 24 hr tablet  Commonly known as:  TOPROL-XL  Take 50 mg by mouth daily.     montelukast 10 MG tablet  Commonly known as:  SINGULAIR  Take 10 mg by mouth at bedtime.     raloxifene 60 MG tablet  Commonly known as:  EVISTA  Take 60 mg by mouth daily.           Total Time in preparing paper work, data evaluation and todays exam - 35 minutes  Auburn Bilberry M.D on 04/04/2015 at 3:07 [facility]  [facility]   Office  [phone]

## 2015-04-04 NOTE — Progress Notes (Signed)
Received pacemaker education. Room air. Paced. Pt reports no chest pain. Prescription given to pt. IV and tele removed. Discharge instructions given to pt. Pt has no further concerns at this time.

## 2015-04-04 NOTE — Discharge Instructions (Signed)

## 2015-04-06 DIAGNOSIS — I5022 Chronic systolic (congestive) heart failure: Secondary | ICD-10-CM | POA: Diagnosis not present

## 2015-04-06 DIAGNOSIS — I442 Atrioventricular block, complete: Secondary | ICD-10-CM | POA: Diagnosis not present

## 2015-04-06 DIAGNOSIS — J42 Unspecified chronic bronchitis: Secondary | ICD-10-CM | POA: Diagnosis not present

## 2015-04-06 DIAGNOSIS — I480 Paroxysmal atrial fibrillation: Secondary | ICD-10-CM | POA: Diagnosis not present

## 2015-04-06 NOTE — Anesthesia Postprocedure Evaluation (Signed)
Anesthesia Post Note  Patient: Natalie KapurHelen S Schneider  Procedure(s) Performed: Procedure(s) (LRB): INSERTION PACEMAKER (Left)  Patient location during evaluation: PACU Anesthesia Type: MAC Level of consciousness: awake, awake and alert and oriented Pain management: pain level controlled Vital Signs Assessment: post-procedure vital signs reviewed and stable Respiratory status: spontaneous breathing Cardiovascular status: blood pressure returned to baseline Postop Assessment: No headache Anesthetic complications: no    Last Vitals:  Filed Vitals:   04/04/15 0733 04/04/15 1113  BP: 142/66   Pulse: 83 86  Temp:    Resp: 18     Last Pain:  Filed Vitals:   04/04/15 1114  PainSc: 0-No pain                 Starr Engel

## 2015-04-15 ENCOUNTER — Emergency Department: Payer: Commercial Managed Care - HMO

## 2015-04-15 ENCOUNTER — Observation Stay
Admission: EM | Admit: 2015-04-15 | Discharge: 2015-04-17 | Disposition: A | Discharge status: Home / Self Care | Payer: Commercial Managed Care - HMO | Attending: Internal Medicine | Admitting: Internal Medicine

## 2015-04-15 ENCOUNTER — Encounter: Payer: Self-pay | Admitting: Emergency Medicine

## 2015-04-15 DIAGNOSIS — Z23 Encounter for immunization: Secondary | ICD-10-CM | POA: Diagnosis not present

## 2015-04-15 DIAGNOSIS — R05 Cough: Secondary | ICD-10-CM | POA: Insufficient documentation

## 2015-04-15 DIAGNOSIS — R55 Syncope and collapse: Secondary | ICD-10-CM | POA: Diagnosis not present

## 2015-04-15 DIAGNOSIS — Z950 Presence of cardiac pacemaker: Secondary | ICD-10-CM | POA: Diagnosis not present

## 2015-04-15 DIAGNOSIS — Z882 Allergy status to sulfonamides status: Secondary | ICD-10-CM | POA: Diagnosis not present

## 2015-04-15 DIAGNOSIS — R42 Dizziness and giddiness: Secondary | ICD-10-CM | POA: Diagnosis not present

## 2015-04-15 DIAGNOSIS — R197 Diarrhea, unspecified: Secondary | ICD-10-CM | POA: Diagnosis not present

## 2015-04-15 DIAGNOSIS — Z881 Allergy status to other antibiotic agents status: Secondary | ICD-10-CM | POA: Insufficient documentation

## 2015-04-15 DIAGNOSIS — I119 Hypertensive heart disease without heart failure: Secondary | ICD-10-CM | POA: Diagnosis not present

## 2015-04-15 DIAGNOSIS — K219 Gastro-esophageal reflux disease without esophagitis: Secondary | ICD-10-CM | POA: Insufficient documentation

## 2015-04-15 DIAGNOSIS — I4891 Unspecified atrial fibrillation: Secondary | ICD-10-CM | POA: Diagnosis not present

## 2015-04-15 DIAGNOSIS — Z87891 Personal history of nicotine dependence: Secondary | ICD-10-CM | POA: Diagnosis not present

## 2015-04-15 DIAGNOSIS — Z7901 Long term (current) use of anticoagulants: Secondary | ICD-10-CM | POA: Diagnosis not present

## 2015-04-15 DIAGNOSIS — I495 Sick sinus syndrome: Secondary | ICD-10-CM | POA: Diagnosis not present

## 2015-04-15 DIAGNOSIS — R0981 Nasal congestion: Secondary | ICD-10-CM | POA: Diagnosis not present

## 2015-04-15 DIAGNOSIS — Z7951 Long term (current) use of inhaled steroids: Secondary | ICD-10-CM | POA: Diagnosis not present

## 2015-04-15 DIAGNOSIS — Z7982 Long term (current) use of aspirin: Secondary | ICD-10-CM | POA: Diagnosis not present

## 2015-04-15 DIAGNOSIS — Z79899 Other long term (current) drug therapy: Secondary | ICD-10-CM | POA: Diagnosis not present

## 2015-04-15 DIAGNOSIS — E871 Hypo-osmolality and hyponatremia: Secondary | ICD-10-CM | POA: Diagnosis not present

## 2015-04-15 DIAGNOSIS — I429 Cardiomyopathy, unspecified: Secondary | ICD-10-CM | POA: Insufficient documentation

## 2015-04-15 DIAGNOSIS — Z8679 Personal history of other diseases of the circulatory system: Secondary | ICD-10-CM | POA: Diagnosis not present

## 2015-04-15 DIAGNOSIS — Z880 Allergy status to penicillin: Secondary | ICD-10-CM | POA: Insufficient documentation

## 2015-04-15 DIAGNOSIS — F419 Anxiety disorder, unspecified: Secondary | ICD-10-CM | POA: Diagnosis not present

## 2015-04-15 DIAGNOSIS — J449 Chronic obstructive pulmonary disease, unspecified: Secondary | ICD-10-CM | POA: Diagnosis not present

## 2015-04-15 DIAGNOSIS — R002 Palpitations: Secondary | ICD-10-CM | POA: Diagnosis present

## 2015-04-15 DIAGNOSIS — M6281 Muscle weakness (generalized): Secondary | ICD-10-CM | POA: Diagnosis not present

## 2015-04-15 LAB — CBC
HEMATOCRIT: 33.8 % — AB (ref 35.0–47.0)
HEMOGLOBIN: 12 g/dL (ref 12.0–16.0)
MCH: 33.7 pg (ref 26.0–34.0)
MCHC: 35.4 g/dL (ref 32.0–36.0)
MCV: 95.2 fL (ref 80.0–100.0)
Platelets: 182 10*3/uL (ref 150–440)
RBC: 3.55 MIL/uL — AB (ref 3.80–5.20)
RDW: 13 % (ref 11.5–14.5)
WBC: 5 10*3/uL (ref 3.6–11.0)

## 2015-04-15 LAB — BASIC METABOLIC PANEL
ANION GAP: 7 (ref 5–15)
BUN: 20 mg/dL (ref 6–20)
CALCIUM: 9.3 mg/dL (ref 8.9–10.3)
CO2: 28 mmol/L (ref 22–32)
Chloride: 94 mmol/L — ABNORMAL LOW (ref 101–111)
Creatinine, Ser: 0.7 mg/dL (ref 0.44–1.00)
Glucose, Bld: 103 mg/dL — ABNORMAL HIGH (ref 65–99)
Potassium: 3.9 mmol/L (ref 3.5–5.1)
Sodium: 129 mmol/L — ABNORMAL LOW (ref 135–145)

## 2015-04-15 LAB — URINALYSIS COMPLETE WITH MICROSCOPIC (ARMC ONLY)
BACTERIA UA: NONE SEEN
Bilirubin Urine: NEGATIVE
Glucose, UA: NEGATIVE mg/dL
Ketones, ur: NEGATIVE mg/dL
LEUKOCYTES UA: NEGATIVE
Nitrite: NEGATIVE
PH: 7 (ref 5.0–8.0)
PROTEIN: NEGATIVE mg/dL
SQUAMOUS EPITHELIAL / LPF: NONE SEEN
Specific Gravity, Urine: 1.004 — ABNORMAL LOW (ref 1.005–1.030)

## 2015-04-15 LAB — TROPONIN I

## 2015-04-15 NOTE — ED Provider Notes (Signed)
[facility] Emergency Department Provider Note  ____________________________________________  Time seen: 11:15 PM  I have reviewed the triage vital signs and the nursing notes.   HISTORY  Chief Complaint Shortness of Breath and Dizziness     HPI Natalie Schneider is a 81 y.o. female presents with acute onset of dizziness while sitting today at home. Patient also admits to shortness of breath consistent with baseline. Patient denies any chest pain. Of note patient had a recent pacemaker placement on 04/04/2015. Patient denies any headache no change and no weakness or numbness.     Past Medical History  Diagnosis Date  . Hypertension   . GERD (gastroesophageal reflux disease)   . Arthritis   . Environmental allergies   . Cough   . Atrial fibrillation (HCC)   . COPD (chronic obstructive pulmonary disease) [facility])     Patient Active Problem List   Diagnosis Date Noted  . Heart block 04/02/2015  . Complete heart block (HCC) 04/01/2015  . S/P total knee replacement 11/05/2014    Past Surgical History  Procedure Laterality Date  . Thyroid surgery    . Breast biopsy Left   . Laparoscopic oopherectomy Bilateral   . Cataracts Bilateral   . Total knee arthroplasty Left 11/05/2014    Procedure: TOTAL KNEE ARTHROPLASTY;  Surgeon: Erin Sons, MD;  Location: [facility] ORS;  Service: Orthopedics;  Laterality: Left;  . Pacemaker insertion Left 04/03/2015    Procedure: INSERTION PACEMAKER;  Surgeon: Sharion Settler, MD;  Location: [facility] ORS;  Service: Cardiovascular;  Laterality: Left;    Current Outpatient Rx  Name  Route  Sig  Dispense  Refill  . acetaminophen (TYLENOL) 325 MG tablet   Oral   Take 2 tablets (650 mg total) by mouth every 4 (four) hours as needed for mild pain.   30 tablet   0   . ALPRAZolam (XANAX) 0.25 MG tablet   Oral   Take 0.25 mg by mouth 2 (two) times daily as needed for anxiety or sleep.          Marland Kitchen aspirin EC 81 MG tablet    Oral   Take 81 mg by mouth daily.         . busPIRone (BUSPAR) 5 MG tablet   Oral   Take 10 mg by mouth 2 (two) times daily.          . clotrimazole-betamethasone (LOTRISONE) cream   Topical   Apply 1 application topically 2 (two) times daily as needed (for itching).         Marland Kitchen diclofenac sodium (VOLTAREN) 1 % GEL   Topical   Apply 2 g topically 4 (four) times daily as needed (for pain).          Marland Kitchen digoxin (LANOXIN) 0.25 MG tablet   Oral   Take 0.125 mg by mouth daily.          . diphenhydrAMINE (BENADRYL) 25 MG tablet   Oral   Take 25 mg by mouth every 6 (six) hours as needed for itching.          . fluticasone (FLONASE) 50 MCG/ACT nasal spray   Each Nare   Place 2 sprays into both nostrils at bedtime.         . furosemide (LASIX) 20 MG tablet   Oral   Take 20 mg by mouth daily as needed for edema.         Marland Kitchen loperamide (IMODIUM) 2 MG capsule   Oral  Take 2 mg by mouth as needed for diarrhea or loose stools.          Marland Kitchen. loratadine (CLARITIN) 10 MG tablet   Oral   Take 10 mg by mouth daily as needed for allergies.         Marland Kitchen. losartan-hydrochlorothiazide (HYZAAR) 100-12.5 MG per tablet   Oral   Take 1 tablet by mouth daily.         . metoprolol succinate (TOPROL-XL) 50 MG 24 hr tablet   Oral   Take 50 mg by mouth daily.          . montelukast (SINGULAIR) 10 MG tablet   Oral   Take 10 mg by mouth at bedtime.         . raloxifene (EVISTA) 60 MG tablet   Oral   Take 60 mg by mouth daily.           Allergies Amoxicillin; Augmentin; Ciprofloxacin; Clindamycin/lincomycin; Dicyclomine; Doxycycline; Flagyl; Levaquin; and Septra  Family History  Problem Relation Age of Onset  . Heart disease Mother   . Heart disease Father   . Cancer Sister   . Cancer Brother     Social History Social History  Substance Use Topics  . Smoking status: Former Smoker    Quit date: 10/23/1974  . Smokeless tobacco: None  . Alcohol Use: No    Review  of Systems  Constitutional: Negative for fever. Eyes: Negative for visual changes. ENT: Negative for sore throat. Cardiovascular: Negative for chest pain. Respiratory: Negative for shortness of breath. Gastrointestinal: Negative for abdominal pain, vomiting and diarrhea. Genitourinary: Negative for dysuria. Musculoskeletal: Negative for back pain. Skin: Negative for rash. Neurological: Negative for headaches, focal weakness or numbness. Positive for dizziness  10-point ROS otherwise negative.  ____________________________________________   PHYSICAL EXAM:  VITAL SIGNS: ED Triage Vitals  Enc Vitals Group     BP 04/15/15 2120 155/74 mmHg     Pulse Rate 04/15/15 2120 88     Resp 04/15/15 2120 19     Temp 04/15/15 2120 98.1 F (36.7 C)     Temp Source 04/15/15 2120 Oral     SpO2 04/15/15 2120 96 %     Weight 04/15/15 2120 145 lb (65.772 kg)     Height 04/15/15 2120 4\' 11"  (1.499 m)     Head Cir --      Peak Flow --      Pain Score --      Pain Loc --      Pain Edu? --      Excl. in GC? --      Constitutional: Alert and oriented. Well appearing and in no distress. Eyes: Conjunctivae are normal. PERRL. Normal extraocular movements. ENT   Head: Normocephalic and atraumatic.   Nose: No congestion/rhinnorhea.   Mouth/Throat: Mucous membranes are moist.   Neck: No stridor. Hematological/Lymphatic/Immunilogical: No cervical lymphadenopathy. Cardiovascular: Normal rate, regular rhythm. Normal and symmetric distal pulses are present in all extremities. No murmurs, rubs, or gallops. Respiratory: Normal respiratory effort without tachypnea nor retractions. Breath sounds are clear and equal bilaterally. No wheezes/rales/rhonchi. Gastrointestinal: Soft and nontender. No distention. There is no CVA tenderness. Genitourinary: deferred Musculoskeletal: Nontender with normal range of motion in all extremities. No joint effusions.  No lower extremity tenderness nor  edema. Neurologic:  Normal speech and language. No gross focal neurologic deficits are appreciated. Speech is normal.  Skin:  Skin is warm, dry and intact. No rash noted. Psychiatric: Mood and affect are normal.  Speech and behavior are normal. Patient exhibits appropriate insight and judgment.  ____________________________________________    LABS (pertinent positives/negatives)  Labs Reviewed  CBC - Abnormal; Notable for the following:    RBC 3.55 (*)    HCT 33.8 (*)    All other components within normal limits  URINALYSIS COMPLETEWITH MICROSCOPIC ([facility] ONLY) - Abnormal; Notable for the following:    Color, Urine COLORLESS (*)    APPearance CLEAR (*)    Specific Gravity, Urine 1.004 (*)    Hgb urine dipstick 1+ (*)    All other components within normal limits  BASIC METABOLIC PANEL - Abnormal; Notable for the following:    Sodium 129 (*)    Chloride 94 (*)    Glucose, Bld 103 (*)    All other components within normal limits  TROPONIN I    ____________________________________________   EKG  ED ECG REPORT I, Rebbie Lauricella, Holliday N, the attending physician, personally viewed and interpreted this ECG.   Date: 04/15/2015  EKG Time: 9:26 PM  Rate: 89  Rhythm: Normal sinus rhythm with left bundle-branch block Axis: Left axis deviation  Intervals:normal  ST&T Change: None   ____________________________________________    RADIOLOGY  DG Chest 2 View (Final result) Result time: 04/15/15 22:25:57   Final result by Rad Results In Interface (04/15/15 22:25:57)   Narrative:   CLINICAL DATA: Cough, elevated blood pressure  EXAM: CHEST 2 VIEW  COMPARISON: 04/04/2015  FINDINGS: There is bilateral mild interstitial thickening, likely chronic. There is no focal parenchymal opacity. There is no pleural effusion or pneumothorax. The heart and mediastinal contours are unremarkable. There is a dual lead cardiac pacemaker.  The osseous structures are  unremarkable.  IMPRESSION: No active cardiopulmonary disease.   Electronically Signed By: Elige Ko On: 04/15/2015 22:25           INITIAL IMPRESSION / ASSESSMENT AND PLAN / ED COURSE  Pertinent labs & imaging results that were available during my care of the patient were reviewed by me and considered in my medical decision making (see chart for details).  Given history of near syncopal episode concern for possible cardiac etiology as such patient will be admitted to the hospitalist staff   ____________________________________________   FINAL CLINICAL IMPRESSION(S) / ED DIAGNOSES  Final diagnoses:  Dizziness  Near syncope      Darci Current, MD 04/17/15 [phone]

## 2015-04-15 NOTE — ED Notes (Signed)
Patient transported to X-ray 

## 2015-04-15 NOTE — ED Notes (Signed)
Pt comes into the ED via EMS c/o shortness of breath and dizziness for the past day.  Patient states she had a pacemaker placed on 1/19.  Patient also complains of a cough that has occurred over the past couple of days.  Patient has h/o HTN. Unremarkable EKG, on eliquis, and 170/70 BP. Patient currently denies any symptoms and says they have for the most part resolved but wanted to be checked out.

## 2015-04-16 ENCOUNTER — Encounter: Payer: Self-pay | Admitting: Internal Medicine

## 2015-04-16 DIAGNOSIS — R55 Syncope and collapse: Secondary | ICD-10-CM | POA: Diagnosis present

## 2015-04-16 DIAGNOSIS — I495 Sick sinus syndrome: Secondary | ICD-10-CM | POA: Diagnosis not present

## 2015-04-16 DIAGNOSIS — R42 Dizziness and giddiness: Secondary | ICD-10-CM | POA: Diagnosis not present

## 2015-04-16 DIAGNOSIS — R002 Palpitations: Secondary | ICD-10-CM | POA: Diagnosis not present

## 2015-04-16 DIAGNOSIS — Z87891 Personal history of nicotine dependence: Secondary | ICD-10-CM | POA: Diagnosis not present

## 2015-04-16 DIAGNOSIS — I429 Cardiomyopathy, unspecified: Secondary | ICD-10-CM | POA: Diagnosis not present

## 2015-04-16 DIAGNOSIS — E871 Hypo-osmolality and hyponatremia: Secondary | ICD-10-CM | POA: Diagnosis not present

## 2015-04-16 DIAGNOSIS — I4891 Unspecified atrial fibrillation: Secondary | ICD-10-CM | POA: Diagnosis not present

## 2015-04-16 DIAGNOSIS — I119 Hypertensive heart disease without heart failure: Secondary | ICD-10-CM | POA: Diagnosis not present

## 2015-04-16 DIAGNOSIS — J449 Chronic obstructive pulmonary disease, unspecified: Secondary | ICD-10-CM | POA: Diagnosis not present

## 2015-04-16 DIAGNOSIS — I10 Essential (primary) hypertension: Secondary | ICD-10-CM | POA: Diagnosis not present

## 2015-04-16 LAB — TROPONIN I
TROPONIN I: 0.03 ng/mL (ref <0.031)
Troponin I: 0.03 ng/mL (ref <0.031)

## 2015-04-16 LAB — DIGOXIN LEVEL

## 2015-04-16 LAB — MAGNESIUM: MAGNESIUM: 1.7 mg/dL (ref 1.7–2.4)

## 2015-04-16 LAB — TSH: TSH: 5.707 u[IU]/mL — AB (ref 0.350–4.500)

## 2015-04-16 MED ORDER — DIGOXIN 125 MCG PO TABS
0.1250 mg | ORAL_TABLET | Freq: Every day | ORAL | Status: DC
  Administered 2015-04-16 – 2015-04-17 (×2): 0.125 mg via ORAL
  Filled 2015-04-16 (×2): qty 1

## 2015-04-16 MED ORDER — ALPRAZOLAM 0.25 MG PO TABS
0.2500 mg | ORAL_TABLET | Freq: Two times a day (BID) | ORAL | Status: DC | PRN
  Administered 2015-04-16: 0.25 mg via ORAL
  Filled 2015-04-16: qty 1

## 2015-04-16 MED ORDER — PNEUMOCOCCAL VAC POLYVALENT 25 MCG/0.5ML IJ INJ
0.5000 mL | INJECTION | INTRAMUSCULAR | Status: AC
  Administered 2015-04-17: 0.5 mL via INTRAMUSCULAR
  Filled 2015-04-16: qty 0.5

## 2015-04-16 MED ORDER — ACETAMINOPHEN 650 MG RE SUPP: 650.0000 mg | Freq: Four times a day (QID) | RECTAL | Status: DC | PRN

## 2015-04-16 MED ORDER — HEPARIN SODIUM (PORCINE) 5000 UNIT/ML IJ SOLN
5000.0000 [IU] | Freq: Three times a day (TID) | INTRAMUSCULAR | Status: DC
  Administered 2015-04-16 – 2015-04-17 (×5): 5000 [IU] via SUBCUTANEOUS
  Filled 2015-04-16 (×5): qty 1

## 2015-04-16 MED ORDER — METOPROLOL SUCCINATE ER 100 MG PO TB24
100.0000 mg | ORAL_TABLET | Freq: Every day | ORAL | Status: DC
  Administered 2015-04-16 – 2015-04-17 (×2): 100 mg via ORAL
  Filled 2015-04-16 (×2): qty 1

## 2015-04-16 MED ORDER — BUSPIRONE HCL 5 MG PO TABS
10.0000 mg | ORAL_TABLET | Freq: Two times a day (BID) | ORAL | Status: DC
  Administered 2015-04-16: 10 mg via ORAL
  Filled 2015-04-16 (×2): qty 2

## 2015-04-16 MED ORDER — LORATADINE 10 MG PO TABS: 10.0000 mg | ORAL_TABLET | Freq: Every day | ORAL | Status: DC | PRN

## 2015-04-16 MED ORDER — HYDROCHLOROTHIAZIDE 12.5 MG PO CAPS: 12.5000 mg | ORAL_CAPSULE | Freq: Every day | ORAL | Status: DC

## 2015-04-16 MED ORDER — LOSARTAN POTASSIUM-HCTZ 100-12.5 MG PO TABS: 1.0000 | ORAL_TABLET | Freq: Every day | ORAL | Status: DC

## 2015-04-16 MED ORDER — MONTELUKAST SODIUM 10 MG PO TABS
10.0000 mg | ORAL_TABLET | Freq: Every day | ORAL | Status: DC
  Administered 2015-04-16: 10 mg via ORAL
  Filled 2015-04-16: qty 1

## 2015-04-16 MED ORDER — ACETAMINOPHEN 325 MG PO TABS
650.0000 mg | ORAL_TABLET | Freq: Four times a day (QID) | ORAL | Status: DC | PRN
  Administered 2015-04-16: 650 mg via ORAL
  Filled 2015-04-16: qty 2

## 2015-04-16 MED ORDER — METOPROLOL SUCCINATE ER 100 MG PO TB24: 100.0000 mg | ORAL_TABLET | Freq: Every day | ORAL | Status: DC

## 2015-04-16 MED ORDER — OXYCODONE HCL 5 MG PO TABS: 5.0000 mg | ORAL_TABLET | ORAL | Status: DC | PRN

## 2015-04-16 MED ORDER — METOPROLOL SUCCINATE ER 50 MG PO TB24
50.0000 mg | ORAL_TABLET | Freq: Every day | ORAL | Status: DC
  Filled 2015-04-16: qty 1

## 2015-04-16 MED ORDER — FLUTICASONE PROPIONATE 50 MCG/ACT NA SUSP
2.0000 | Freq: Every day | NASAL | Status: DC
  Filled 2015-04-16: qty 16

## 2015-04-16 MED ORDER — SODIUM CHLORIDE 0.9 % IJ SOLN: 3.0000 mL | Freq: Two times a day (BID) | INTRAMUSCULAR | Status: DC

## 2015-04-16 MED ORDER — MORPHINE SULFATE (PF) 2 MG/ML IV SOLN
2.0000 mg | INTRAVENOUS | Status: DC | PRN
Start: 2015-04-16 — End: 2015-04-17

## 2015-04-16 MED ORDER — LOSARTAN POTASSIUM 50 MG PO TABS
100.0000 mg | ORAL_TABLET | Freq: Every day | ORAL | Status: DC
  Administered 2015-04-16 – 2015-04-17 (×2): 100 mg via ORAL
  Filled 2015-04-16 (×2): qty 2

## 2015-04-16 MED ORDER — ONDANSETRON HCL 4 MG/2ML IJ SOLN: 4.0000 mg | Freq: Four times a day (QID) | INTRAMUSCULAR | Status: DC | PRN

## 2015-04-16 MED ORDER — RALOXIFENE HCL 60 MG PO TABS
60.0000 mg | ORAL_TABLET | Freq: Every day | ORAL | Status: DC
  Administered 2015-04-16 – 2015-04-17 (×2): 60 mg via ORAL
  Filled 2015-04-16 (×2): qty 1

## 2015-04-16 MED ORDER — SODIUM CHLORIDE 0.9 % IV SOLN
INTRAVENOUS | Status: DC
  Administered 2015-04-16 (×2): via INTRAVENOUS

## 2015-04-16 MED ORDER — DIPHENHYDRAMINE HCL 25 MG PO CAPS: 25.0000 mg | ORAL_CAPSULE | Freq: Four times a day (QID) | ORAL | Status: DC | PRN

## 2015-04-16 MED ORDER — ONDANSETRON HCL 4 MG PO TABS: 4.0000 mg | ORAL_TABLET | Freq: Four times a day (QID) | ORAL | Status: DC | PRN

## 2015-04-16 MED ORDER — BUSPIRONE HCL 5 MG PO TABS: 10.0000 mg | ORAL_TABLET | Freq: Two times a day (BID) | ORAL | Status: DC | PRN

## 2015-04-16 MED ORDER — ASPIRIN EC 81 MG PO TBEC
81.0000 mg | DELAYED_RELEASE_TABLET | Freq: Every day | ORAL | Status: DC
  Administered 2015-04-16 – 2015-04-17 (×2): 81 mg via ORAL
  Filled 2015-04-16 (×2): qty 1

## 2015-04-16 NOTE — Consult Note (Signed)
[facility] CLINIC CARDIOLOGY [facility]  CARDIOLOGY CONSULT NOTE  Patient ID: Natalie Schneider MRN: [id number] DOB/AGE: Jun 14, 1923 81 y.o.  Admit date: 04/15/2015 Referring Physician [place] Primary Physician Dr. Dareen Piano Primary Cardiologist Dr. Gwen Pounds Reason for Consultation dizziness  HPI: Patient is an 81 year old female who was admitted with lightheadedness "near-syncope" and hypertension.  Asked to see regarding this.  Patient has a history of a cardiomyopathy with ejection fraction of 40%.  She has had a history of atrial fibrillation, intermittent high-grade heart block.  She was recently admitted and underwent placement of a permanent pacemaker.  She was taken off of metoprolol prior to her pacemaker placement.  She was discharged on furosemide, 0.125 mg of digoxin 50 mg of metoprolol succinate.  She states she has been compliant with her medications.  Her daughter who was with fair as a lot of the history along with the patient.  Currently she appears to be in atrial fibrillation with variable ventricular response.  Her pacemaker is sensing and capturing normally.  She is currently on no anticoagulation due to concern over bleeding.  Her average heart rate has been 67- 90. Blood pressure has in the 130s to 170 systolic.  ROS Review of Systems - History obtained from child, chart review and the patient General ROS: positive for  - fatigue and dizziness Respiratory ROS: negative Cardiovascular ROS: positive for - palpitations and rapid heart rate Gastrointestinal ROS: no abdominal pain, change in bowel habits, or black or bloody stools Musculoskeletal ROS: negative Neurological ROS: no TIA or stroke symptoms   Past Medical History  Diagnosis Date  . Hypertension   . GERD (gastroesophageal reflux disease)   . Arthritis   . Environmental allergies   . Cough   . Atrial fibrillation (HCC)   . COPD (chronic obstructive pulmonary disease) (HCC)     Family History   Problem Relation Age of Onset  . Heart disease Mother   . Heart disease Father   . Cancer Sister   . Cancer Brother     Social History   Social History  . Marital Status: Widowed    Spouse Name: N/A  . Number of Children: N/A  . Years of Education: N/A   Occupational History  . Not on file.   Social History Main Topics  . Smoking status: Former Smoker    Quit date: 10/23/1974  . Smokeless tobacco: Not on file  . Alcohol Use: No  . Drug Use: No  . Sexual Activity: No   Other Topics Concern  . Not on file   Social History Narrative    Past Surgical History  Procedure Laterality Date  . Thyroid surgery    . Breast biopsy Left   . Laparoscopic oopherectomy Bilateral   . Cataracts Bilateral   . Total knee arthroplasty Left 11/05/2014    Procedure: TOTAL KNEE ARTHROPLASTY;  Surgeon: Erin Sons, MD;  Location: [facility] ORS;  Service: Orthopedics;  Laterality: Left;  . Pacemaker insertion Left 04/03/2015    Procedure: INSERTION PACEMAKER;  Surgeon: Sharion Settler, MD;  Location: [facility] ORS;  Service: Cardiovascular;  Laterality: Left;     Prescriptions prior to admission  Medication Sig Dispense Refill Last Dose  . acetaminophen (TYLENOL) 325 MG tablet Take 2 tablets (650 mg total) by mouth every 4 (four) hours as needed for mild pain. 30 tablet 0 04/15/2015 at Unknown time  . ALPRAZolam (XANAX) 0.25 MG tablet Take 0.25 mg by mouth 2 (two) times daily as needed for anxiety  or sleep.    Past Week at Unknown time  . busPIRone (BUSPAR) 5 MG tablet Take 10 mg by mouth 2 (two) times daily.    04/16/2015 at Unknown time  . clotrimazole-betamethasone (LOTRISONE) cream Apply 1 application topically 2 (two) times daily as needed (for itching).   Past Month at Unknown time  . diphenhydrAMINE (BENADRYL) 25 MG tablet Take 25 mg by mouth every 6 (six) hours as needed for itching.    04/15/2015 at Unknown time  . fluticasone (FLONASE) 50 MCG/ACT nasal spray Place 2 sprays into both nostrils  at bedtime.   Past Week at Unknown time  . furosemide (LASIX) 20 MG tablet Take 20 mg by mouth daily as needed for edema.   04/15/2015 at Unknown time  . loperamide (IMODIUM) 2 MG capsule Take 2 mg by mouth as needed for diarrhea or loose stools.    Past Month at Unknown time  . losartan-hydrochlorothiazide (HYZAAR) 100-12.5 MG per tablet Take 1 tablet by mouth daily.   04/15/2015 at Unknown time  . montelukast (SINGULAIR) 10 MG tablet Take 10 mg by mouth at bedtime.   Past Week at Unknown time  . raloxifene (EVISTA) 60 MG tablet Take 60 mg by mouth daily.   04/15/2015 at Unknown time  . aspirin EC 81 MG tablet Take 81 mg by mouth daily.   Not Taking at Unknown time  . diclofenac sodium (VOLTAREN) 1 % GEL Apply 2 g topically 4 (four) times daily as needed (for pain).    Not Taking at Unknown time  . digoxin (LANOXIN) 0.25 MG tablet Take 0.125 mg by mouth daily.    Not Taking at Unknown time  . loratadine (CLARITIN) 10 MG tablet Take 10 mg by mouth daily as needed for allergies.   Not Taking at Unknown time  . metoprolol succinate (TOPROL-XL) 50 MG 24 hr tablet Take 50 mg by mouth daily.    Not Taking at Unknown time    Physical Exam: Blood pressure 139/61, pulse 86, temperature 98 F (36.7 C), temperature source Oral, resp. rate 16, height  (1.499 m), weight 61.735 kg (136 lb 1.6 oz), SpO2 96 %.    General appearance: alert and cooperative Head: Normocephalic, without obvious abnormality, atraumatic Resp: clear to auscultation bilaterally Cardio: irregularly irregular rhythm GI: soft, non-tender; bowel sounds normal; no masses,  no organomegaly Extremities: extremities normal, atraumatic, no cyanosis or edema Neurologic: Grossly normal Labs:   Lab Results  Component Value Date   WBC 5.0 04/15/2015   HGB 12.0 04/15/2015   HCT 33.8* 04/15/2015   MCV 95.2 04/15/2015   PLT 182 04/15/2015    Recent Labs Lab 04/15/15 2201  NA 129*  K 3.9  CL 94*  CO2 28  BUN 20  CREATININE  0.70  CALCIUM 9.3  GLUCOSE 103*   Lab Results  Component Value Date   TROPONINI 0.03 04/16/2015      Radiology:   No active cardiopulmonary disease EKG: atrial fibrillation with intermittent ventricular paced  ASSESSMENT AND PLAN:   81 year old female with history of sick sinus syndrome and atrial fibrillation with mild cardiomyopathy admitted with which she described as near-syncope.  There was no loss of consciousness.  Evaluation when the EMS arrived was hypertension and atrial fibrillation with variable but fairly rapid ventricular response.  She had been on digoxin 0.125 mg daily as well as metoprolol succinate  50 mg daily.  She was not anticoagulated.  Since admission she has had atrial fibrillation with intermittent  ventricular pacing with heart rates between 60-90.  Pacemaker is sensing and capturing appropriately.  Will need to attempt to better control her atrial fibrillation as this may be playing a role in her symptoms.  She is not hypotensive.  Pacemaker is functioning normally and does not appear to be or role in her symptoms.  Will follow with increasing metoprolol succinate 100 mg daily. Signed: Dalia HeadingFATH,Alzina Golda A. MD, [facility] 04/16/2015, 10:41 AM

## 2015-04-16 NOTE — Progress Notes (Signed)
Patient ID: Natalie Schneider, female   DOB: 1923/06/15, 81 y.o.   MRN: [id number] [facility] PROGRESS NOTE  PCP: Lauro Regulus., MD  HPI/Subjective: Patient did not sleep last night since she was admitted overnight. She felt lightheaded and nauseous and had some near syncopal episode. In the ER she was found to be hypertensive and had echo to be on telemetry. She was also found to have a low sodium.  Objective: Filed Vitals:   04/16/15 1036 04/16/15 1155  BP: 139/61 157/74  Pulse: 86 81  Temp:  97.4 F (36.3 C)  Resp:  20    Filed Weights   04/15/15 2120 04/16/15 0250  Weight: 65.772 kg (145 lb) 61.735 kg (136 lb 1.6 oz)    ROS: Review of Systems  Constitutional: Positive for malaise/fatigue. Negative for fever and chills.  Eyes: Negative for blurred vision.  Respiratory: Negative for cough and shortness of breath.   Cardiovascular: Negative for chest pain.  Gastrointestinal: Negative for nausea, vomiting, abdominal pain, diarrhea and constipation.  Genitourinary: Negative for dysuria.  Musculoskeletal: Negative for joint pain.  Neurological: Positive for weakness. Negative for dizziness and headaches.   Exam: Physical Exam  Constitutional: She is oriented to person, place, and time.  HENT:  Nose: No mucosal edema.  Mouth/Throat: No oropharyngeal exudate or posterior oropharyngeal edema.  Eyes: Conjunctivae, EOM and lids are normal. Pupils are equal, round, and reactive to light.  Neck: No JVD present. Carotid bruit is not present. No edema present. No thyroid mass and no thyromegaly present.  Cardiovascular: S1 normal and S2 normal.  Exam reveals no gallop.   Murmur heard.  Systolic murmur is present with a grade of 2/6  Pulses:      Dorsalis pedis pulses are 2+ on the right side, and 2+ on the left side.  Respiratory: No respiratory distress. She has no wheezes. She has no rhonchi. She has no rales.  GI: Soft. Bowel sounds are normal. There is no  tenderness.  Musculoskeletal:       Right ankle: She exhibits swelling.       Left ankle: She exhibits swelling.  Lymphadenopathy:    She has no cervical adenopathy.  Neurological: She is alert and oriented to person, place, and time. No cranial nerve deficit.  Skin: Skin is warm. No rash noted. Nails show no clubbing.  Psychiatric: She has a normal mood and affect.    Data Reviewed: Basic Metabolic Panel:  Recent Labs Lab 04/15/15 2137 04/15/15 2201  NA  --  129*  K  --  3.9  CL  --  94*  CO2  --  28  GLUCOSE  --  103*  BUN  --  20  CREATININE  --  0.70  CALCIUM  --  9.3  MG 1.7  --    CBC:  Recent Labs Lab 04/15/15 2137  WBC 5.0  HGB 12.0  HCT 33.8*  MCV 95.2  PLT 182      Studies: Dg Chest 2 View  04/15/2015  CLINICAL DATA:  Cough, elevated blood pressure EXAM: CHEST  2 VIEW COMPARISON:  04/04/2015 FINDINGS: There is bilateral mild interstitial thickening, likely chronic. There is no focal parenchymal opacity. There is no pleural effusion or pneumothorax. The heart and mediastinal contours are unremarkable. There is a dual lead cardiac pacemaker. The osseous structures are unremarkable. IMPRESSION: No active cardiopulmonary disease. Electronically Signed   By: Elige Ko   On: 04/15/2015 22:25    Scheduled Meds: .  aspirin EC  81 mg Oral Daily  . digoxin  0.125 mg Oral Daily  . fluticasone  2 spray Each Nare QHS  . heparin  5,000 Units Subcutaneous 3 times per day  . losartan  100 mg Oral Daily  . metoprolol succinate  100 mg Oral Daily  . montelukast  10 mg Oral QHS  . [START ON 04/17/2015] pneumococcal 23 valent vaccine  0.5 mL Intramuscular Tomorrow-1000  . raloxifene  60 mg Oral Daily  . sodium chloride  3 mL Intravenous Q12H   Continuous Infusions: . sodium chloride 50 mL/hr at 04/16/15 0739    Assessment/Plan:  1. Hyponatremia. I think the cause is hydrochlorothiazide. I got rid of this medication. Continue gentle IV fluid hydration overnight.  Check a sodium tomorrow morning. 2. Near syncope. I will check orthostatic vital signs. Get physical therapy evaluation. 3. Accelerated hypertension by EMS- blood pressure better even holding hydrochlorothiazide. 4. Anxiety- patient takes BuSpar only when necessary 5. Environmental allergies- continue Singulair 6. Atrial fibrillation history- on digoxin, metoprolol and aspirin 7. History of congestive heart failure- currently no signs of heart failure. Monitor closely with IV fluid hydration.  Code Status:     Code Status Orders        Start     Ordered   04/16/15 0102  Full code   Continuous     04/16/15 0102    Advance Directive Documentation        Most Recent Value   Type of Advance Directive  Living will   Pre-existing out of facility DNR order (yellow form or pink MOST form)     "MOST" Form in Place?       Family Communication: Daughter at bedside Disposition Plan: Home potentially tomorrow  Consultants:  Cardiology  Time spent: 35 minutes  Alford HighlandWIETING, Labrenda Lasky  [facility]

## 2015-04-16 NOTE — Care Management Obs Status (Signed)
MEDICARE OBSERVATION STATUS NOTIFICATION   Patient Details  Name: Natalie KapurHelen S Navejas MRN: [id number] Date of Birth: 1923-07-10   Medicare Observation Status Notification Given:  Yes    Eber HongGreene, Spence Soberano R, RN 04/16/2015, 9:00 AM

## 2015-04-16 NOTE — Progress Notes (Signed)
Patient arrived to 2A Room 233. Patient denies pain and all questions answered. Patient oriented to unit and Fall Safety Plan signed. Skin assessment completed with Alisa RN and skin intact with the exception of some minor bruising on the L arm. Nursing staff will continue to monitor. Lamonte RicherKara A Laureen Frederic, RN

## 2015-04-16 NOTE — H&P (Signed)
[facility] [place] [facility]   PATIENT NAME: Natalie Schneider    MR#:  [id number]  DATE OF BIRTH:  July 01, 1923   DATE OF ADMISSION:  04/15/2015  PRIMARY CARE PHYSICIAN: Lauro Regulus., MD   REQUESTING/REFERRING PHYSICIAN: Manson Passey  CHIEF COMPLAINT:   Chief Complaint  Patient presents with  . Shortness of Breath  . Dizziness    HISTORY OF PRESENT ILLNESS:  Natalie Schneider  is a 81 y.o. female with a known history of complete heart block status post pacemaker insertion 04/04/15 who is presenting with near syncopal episode. She states she was in her usual state of health sitting on chair felt nauseous followed by lightheadedness. She did not pass out, experienced chest pain, experience palpitations. Given the symptoms that presented to Hospital further workup and evaluation. On EMS arrival noted to be hypertensive systolic blood pressure in the 180s to 190s. In the emergency department she is noted to have large amount of ectopy on telemetry.  PAST MEDICAL HISTORY:   Past Medical History  Diagnosis Date  . Hypertension   . GERD (gastroesophageal reflux disease)   . Arthritis   . Environmental allergies   . Cough   . Atrial fibrillation (HCC)   . COPD (chronic obstructive pulmonary disease) (HCC)     PAST SURGICAL HISTORY:   Past Surgical History  Procedure Laterality Date  . Thyroid surgery    . Breast biopsy Left   . Laparoscopic oopherectomy Bilateral   . Cataracts Bilateral   . Total knee arthroplasty Left 11/05/2014    Procedure: TOTAL KNEE ARTHROPLASTY;  Surgeon: Erin Sons, MD;  Location: [facility] ORS;  Service: Orthopedics;  Laterality: Left;  . Pacemaker insertion Left 04/03/2015    Procedure: INSERTION PACEMAKER;  Surgeon: Sharion Settler, MD;  Location: [facility] ORS;  Service: Cardiovascular;  Laterality: Left;    SOCIAL HISTORY:   Social History  Substance Use Topics  . Smoking status: Former Smoker    Quit date: 10/23/1974   . Smokeless tobacco: Not on file  . Alcohol Use: No    FAMILY HISTORY:   Family History  Problem Relation Age of Onset  . Heart disease Mother   . Heart disease Father   . Cancer Sister   . Cancer Brother     DRUG ALLERGIES:   Allergies  Allergen Reactions  . Amoxicillin Rash  . Augmentin [Amoxicillin-Pot Clavulanate] Rash  . Ciprofloxacin Rash  . Clindamycin/Lincomycin Rash  . Dicyclomine Rash  . Doxycycline Rash  . Flagyl [Metronidazole] Rash  . Levaquin [Levofloxacin In D5w] Rash  . Septra [Sulfamethoxazole-Trimethoprim] Rash    REVIEW OF SYSTEMS:  REVIEW OF SYSTEMS:  CONSTITUTIONAL: Denies fevers, chills, fatigue, weakness.  EYES: Denies blurred vision, double vision, or eye pain.  EARS, NOSE, THROAT: Denies tinnitus, ear pain, hearing loss.  RESPIRATORY: denies cough, shortness of breath, wheezing  CARDIOVASCULAR: Denies chest pain, palpitations, edema.  GASTROINTESTINAL: Positive nausea, denies vomiting, diarrhea, abdominal pain.  GENITOURINARY: Denies dysuria, hematuria.  ENDOCRINE: Denies nocturia or thyroid problems. HEMATOLOGIC AND LYMPHATIC: Denies easy bruising or bleeding.  SKIN: Denies rash or lesions.  MUSCULOSKELETAL: Denies pain in neck, back, shoulder, knees, hips, or further arthritic symptoms.  NEUROLOGIC: Denies paralysis, paresthesias.  PSYCHIATRIC: Denies anxiety or depressive symptoms. Otherwise full review of systems performed by me is negative.   MEDICATIONS AT HOME:   Prior to Admission medications   Medication Sig Start Date End Date Taking? Authorizing Provider  acetaminophen (TYLENOL) 325 MG tablet Take 2 tablets (  650 mg total) by mouth every 4 (four) hours as needed for mild pain. 04/04/15   Auburn BilberryShreyang Patel, MD  ALPRAZolam Prudy Feeler(XANAX) 0.25 MG tablet Take 0.25 mg by mouth 2 (two) times daily as needed for anxiety or sleep.     Historical Provider, MD  aspirin EC 81 MG tablet Take 81 mg by mouth daily.    Historical Provider, MD   busPIRone (BUSPAR) 5 MG tablet Take 10 mg by mouth 2 (two) times daily.     Historical Provider, MD  clotrimazole-betamethasone (LOTRISONE) cream Apply 1 application topically 2 (two) times daily as needed (for itching).    Historical Provider, MD  diclofenac sodium (VOLTAREN) 1 % GEL Apply 2 g topically 4 (four) times daily as needed (for pain).     Historical Provider, MD  digoxin (LANOXIN) 0.25 MG tablet Take 0.125 mg by mouth daily.     Historical Provider, MD  diphenhydrAMINE (BENADRYL) 25 MG tablet Take 25 mg by mouth every 6 (six) hours as needed for itching.     Historical Provider, MD  fluticasone (FLONASE) 50 MCG/ACT nasal spray Place 2 sprays into both nostrils at bedtime.    Historical Provider, MD  furosemide (LASIX) 20 MG tablet Take 20 mg by mouth daily as needed for edema.    Historical Provider, MD  loperamide (IMODIUM) 2 MG capsule Take 2 mg by mouth as needed for diarrhea or loose stools.     Historical Provider, MD  loratadine (CLARITIN) 10 MG tablet Take 10 mg by mouth daily as needed for allergies.    Historical Provider, MD  losartan-hydrochlorothiazide (HYZAAR) 100-12.5 MG per tablet Take 1 tablet by mouth daily.    Historical Provider, MD  metoprolol succinate (TOPROL-XL) 50 MG 24 hr tablet Take 50 mg by mouth daily.     Historical Provider, MD  montelukast (SINGULAIR) 10 MG tablet Take 10 mg by mouth at bedtime.    Historical Provider, MD  raloxifene (EVISTA) 60 MG tablet Take 60 mg by mouth daily.    Historical Provider, MD      VITAL SIGNS:  Blood pressure 134/89, pulse 73, temperature 98.1 F (36.7 C), temperature source Oral, resp. rate 19, height 4\' 11"  (1.499 m), weight 145 lb (65.772 kg), SpO2 94 %.  PHYSICAL EXAMINATION:  VITAL SIGNS: Filed Vitals:   04/16/15 0030 04/16/15 0100  BP: 147/71 134/89  Pulse: 91 73  Temp:    Resp: 17 19   GENERAL:81 y.o.female currently in no acute distress.  HEAD: Normocephalic, atraumatic.  EYES: Pupils equal, round,  reactive to light. Extraocular muscles intact. No scleral icterus.  MOUTH: Moist mucosal membrane. Dentition intact. No abscess noted.  EAR, NOSE, THROAT: Clear without exudates. No external lesions.  NECK: Supple. No thyromegaly. No nodules. No JVD.  PULMONARY: Clear to ascultation, without wheeze rails or rhonci. No use of accessory muscles, Good respiratory effort. good air entry bilaterally CHEST: Nontender to palpation. Dressing from pacemaker site still in place with no surrounding erythema or drainage CARDIOVASCULAR: S1 and S2. Irregular rate and rhythm. No murmurs, rubs, or gallops. No edema. Pedal pulses 2+ bilaterally.  GASTROINTESTINAL: Soft, nontender, nondistended. No masses. Positive bowel sounds. No hepatosplenomegaly.  MUSCULOSKELETAL: No swelling, clubbing, or edema. Range of motion full in all extremities.  NEUROLOGIC: Cranial nerves II through XII are intact. No gross focal neurological deficits. Sensation intact. Reflexes intact.  SKIN: No ulceration, lesions, rashes, or cyanosis. Skin warm and dry. Turgor intact.  PSYCHIATRIC: Mood, affect within normal limits. The patient is  awake, alert and oriented x 3. Insight, judgment intact.    LABORATORY PANEL:   CBC  Recent Labs Lab 04/15/15 2137  WBC 5.0  HGB 12.0  HCT 33.8*  PLT 182   ------------------------------------------------------------------------------------------------------------------  Chemistries   Recent Labs Lab 04/15/15 2137 04/15/15 2201  NA  --  129*  K  --  3.9  CL  --  94*  CO2  --  28  GLUCOSE  --  103*  BUN  --  20  CREATININE  --  0.70  CALCIUM  --  9.3  MG 1.7  --    ------------------------------------------------------------------------------------------------------------------  Cardiac Enzymes  Recent Labs Lab 04/15/15 2201  TROPONINI <0.03   ------------------------------------------------------------------------------------------------------------------  RADIOLOGY:   Dg Chest 2 View  04/15/2015  CLINICAL DATA:  Cough, elevated blood pressure EXAM: CHEST  2 VIEW COMPARISON:  04/04/2015 FINDINGS: There is bilateral mild interstitial thickening, likely chronic. There is no focal parenchymal opacity. There is no pleural effusion or pneumothorax. The heart and mediastinal contours are unremarkable. There is a dual lead cardiac pacemaker. The osseous structures are unremarkable. IMPRESSION: No active cardiopulmonary disease. Electronically Signed   By: Elige Ko   On: 04/15/2015 22:25    EKG:   Orders placed or performed during the hospital encounter of 04/15/15  . ED EKG  . ED EKG  . EKG 12-Lead  . EKG 12-Lead    IMPRESSION AND PLAN:   81 year old Caucasian female history of third-degree heart block status post pacemaker insertion presenting after a near syncopal episode  1. Near syncope: IV fluid hydration, placed on telemetry, [employer] evaluation of pacemaker, consult cardiology, check magnesium level will check digoxin level II. Hyponatremia: Check TSH, hold Lasix, dental IV fluid hydration 3. Essential hypertension: Metoprolol 4. Venous embolism prophylactic: Heparin subcutaneous    All the records are reviewed and case discussed with ED provider. Management plans discussed with the patient, family and they are in agreement.  CODE STATUS: Full  TOTAL TIME TAKING CARE OF THIS PATIENT: 35 minutes.    Hower,  Mardi Mainland.D on 04/16/2015 at 1:19 AM  Between 7am to 6pm - Pager - [phone]  After 6pm: House Pager: - [phone]  [facility]  Office  [phone]  CC: Primary care physician; Lauro Regulus., MD

## 2015-04-16 NOTE — Progress Notes (Addendum)
Per Dr. Lady GaryFath start patient on 100mg  metoprolol today

## 2015-04-17 DIAGNOSIS — I429 Cardiomyopathy, unspecified: Secondary | ICD-10-CM | POA: Diagnosis not present

## 2015-04-17 DIAGNOSIS — I119 Hypertensive heart disease without heart failure: Secondary | ICD-10-CM | POA: Diagnosis not present

## 2015-04-17 DIAGNOSIS — R42 Dizziness and giddiness: Secondary | ICD-10-CM | POA: Diagnosis not present

## 2015-04-17 DIAGNOSIS — R55 Syncope and collapse: Secondary | ICD-10-CM | POA: Diagnosis not present

## 2015-04-17 DIAGNOSIS — I495 Sick sinus syndrome: Secondary | ICD-10-CM | POA: Diagnosis not present

## 2015-04-17 DIAGNOSIS — I10 Essential (primary) hypertension: Secondary | ICD-10-CM | POA: Diagnosis not present

## 2015-04-17 DIAGNOSIS — F419 Anxiety disorder, unspecified: Secondary | ICD-10-CM | POA: Diagnosis not present

## 2015-04-17 DIAGNOSIS — E871 Hypo-osmolality and hyponatremia: Secondary | ICD-10-CM | POA: Diagnosis not present

## 2015-04-17 DIAGNOSIS — Z87891 Personal history of nicotine dependence: Secondary | ICD-10-CM | POA: Diagnosis not present

## 2015-04-17 DIAGNOSIS — J449 Chronic obstructive pulmonary disease, unspecified: Secondary | ICD-10-CM | POA: Diagnosis not present

## 2015-04-17 DIAGNOSIS — I4891 Unspecified atrial fibrillation: Secondary | ICD-10-CM | POA: Diagnosis not present

## 2015-04-17 LAB — BASIC METABOLIC PANEL
ANION GAP: 4 — AB (ref 5–15)
BUN: 13 mg/dL (ref 6–20)
CALCIUM: 8.4 mg/dL — AB (ref 8.9–10.3)
CO2: 27 mmol/L (ref 22–32)
CREATININE: 0.5 mg/dL (ref 0.44–1.00)
Chloride: 101 mmol/L (ref 101–111)
GFR calc Af Amer: >60 mL/min (ref >60)
GLUCOSE: 88 mg/dL (ref 65–99)
Potassium: 3.8 mmol/L (ref 3.5–5.1)
Sodium: 132 mmol/L — ABNORMAL LOW (ref 135–145)

## 2015-04-17 MED ORDER — LOSARTAN POTASSIUM 100 MG PO TABS: 100.0000 mg | ORAL_TABLET | Freq: Every day | ORAL | Status: AC

## 2015-04-17 MED ORDER — METOPROLOL SUCCINATE ER 100 MG PO TB24: 100.0000 mg | ORAL_TABLET | Freq: Every day | ORAL | Status: AC

## 2015-04-17 NOTE — Progress Notes (Signed)
Patient d/c'd home. Education provided, no questions at this time. Patient to be picked up by daughter. Telemetry removed. Taura Lamarre R [place]  

## 2015-04-17 NOTE — Discharge Summary (Signed)
[facility] [place] at [facility]   PATIENT NAME: Natalie Schneider    MR#:  [id number]  DATE OF BIRTH:  Dec 18, 1923  DATE OF ADMISSION:  04/15/2015 ADMITTING PHYSICIAN: Wyatt Haste, MD  DATE OF DISCHARGE: 04/17/2015  PRIMARY CARE PHYSICIAN: Lauro Regulus., MD    ADMISSION DIAGNOSIS:  Dizziness [R42] Near syncope [R55]  DISCHARGE DIAGNOSIS:  Principal Problem:   Near syncope   SECONDARY DIAGNOSIS:   Past Medical History  Diagnosis Date  . Hypertension   . GERD (gastroesophageal reflux disease)   . Arthritis   . Environmental allergies   . Cough   . Atrial fibrillation (HCC)   . COPD (chronic obstructive pulmonary disease) (HCC)     HOSPITAL COURSE:   1. Hyponatremia- I believe this is secondary to the patient's hydrochlorothiazide. Hydrochlorothiazide is not a good medication for this patient. Sodium did come up with stopping of this medication and gentle IV fluid hydration. 2. Near syncope- patient feeling better. 3. Atrial fibrillation- cardiology increased Toprol-XL 100 mg daily. Prescription written for this. Patient states that she was recently started on eliquis, which she can go back on. 4. COPD- respiratory status stable 5. Essential hypertension- blood pressure under good control 6. Nasal congestion- patient takes Flonase 7. Anxiety- no changes in psychiatric medications  DISCHARGE CONDITIONS:   Satisfactory  CONSULTS OBTAINED:  Treatment Team:  Dalia Heading, MD Lamar Blinks, MD  DRUG ALLERGIES:   Allergies  Allergen Reactions  . Amoxicillin Rash  . Augmentin [Amoxicillin-Pot Clavulanate] Rash  . Ciprofloxacin Rash  . Clindamycin/Lincomycin Rash  . Dicyclomine Rash  . Doxycycline Rash  . Flagyl [Metronidazole] Rash  . Levaquin [Levofloxacin In D5w] Rash  . Septra [Sulfamethoxazole-Trimethoprim] Rash    DISCHARGE MEDICATIONS:   Current Discharge Medication List    START taking these medications   Details  losartan (COZAAR) 100 MG tablet Take 1 tablet (100 mg total) by mouth daily. Qty: 30 tablet, Refills: 0      CONTINUE these medications which have CHANGED   Details  metoprolol succinate (TOPROL-XL) 100 MG 24 hr tablet Take 1 tablet (100 mg total) by mouth daily. Take with or immediately following a meal. Qty: 30 tablet, Refills: 0      CONTINUE these medications which have NOT CHANGED   Details  acetaminophen (TYLENOL) 325 MG tablet Take 2 tablets (650 mg total) by mouth every 4 (four) hours as needed for mild pain. Qty: 30 tablet, Refills: 0    ALPRAZolam (XANAX) 0.25 MG tablet Take 0.25 mg by mouth 2 (two) times daily as needed for anxiety or sleep.     busPIRone (BUSPAR) 5 MG tablet Take 10 mg by mouth 2 (two) times daily.     clotrimazole-betamethasone (LOTRISONE) cream Apply 1 application topically 2 (two) times daily as needed (for itching).    diphenhydrAMINE (BENADRYL) 25 MG tablet Take 25 mg by mouth every 6 (six) hours as needed for itching.     fluticasone (FLONASE) 50 MCG/ACT nasal spray Place 2 sprays into both nostrils at bedtime.    furosemide (LASIX) 20 MG tablet Take 20 mg by mouth daily as needed for edema.    loperamide (IMODIUM) 2 MG capsule Take 2 mg by mouth as needed for diarrhea or loose stools.     montelukast (SINGULAIR) 10 MG tablet Take 10 mg by mouth at bedtime.    raloxifene (EVISTA) 60 MG tablet Take 60 mg by mouth daily.    aspirin EC 81 MG tablet  Take 81 mg by mouth daily.    diclofenac sodium (VOLTAREN) 1 % GEL Apply 2 g topically 4 (four) times daily as needed (for pain).     digoxin (LANOXIN) 0.25 MG tablet Take 0.125 mg by mouth daily.     loratadine (CLARITIN) 10 MG tablet Take 10 mg by mouth daily as needed for allergies.      STOP taking these medications     losartan-hydrochlorothiazide (HYZAAR) 100-12.5 MG per tablet        Patient tells me that she takes eliquis- which is okay to go back on.  DISCHARGE  INSTRUCTIONS:    Follow-up with Dr. Dareen PianoAnderson 1 week  If you experience worsening of your admission symptoms, develop shortness of breath, life threatening emergency, suicidal or homicidal thoughts you must seek medical attention immediately by calling 911 or calling your MD immediately  if symptoms less severe.  You Must read complete instructions/literature along with all the possible adverse reactions/side effects for all the Medicines you take and that have been prescribed to you. Take any new Medicines after you have completely understood and accept all the possible adverse reactions/side effects.   Please note  You were cared for by a hospitalist during your hospital stay. If you have any questions about your discharge medications or the care you received while you were in the hospital after you are discharged, you can call the unit and asked to speak with the hospitalist on call if the hospitalist that took care of you is not available. Once you are discharged, your primary care physician will handle any further medical issues. Please note that NO REFILLS for any discharge medications will be authorized once you are discharged, as it is imperative that you return to your primary care physician (or establish a relationship with a primary care physician if you do not have one) for your aftercare needs so that they can reassess your need for medications and monitor your lab values.    Today   CHIEF COMPLAINT:   Chief Complaint  Patient presents with  . Shortness of Breath  . Dizziness    HISTORY OF PRESENT ILLNESS:  Natalie Schneider  is a 81 y.o. female came in with near syncope and found to have hyponatremia   VITAL SIGNS:  Blood pressure 134/73, pulse 84, temperature 97.8 F (36.6 C), temperature source Oral, resp. rate 18, height 4\' 11"  (1.499 m), weight 61.735 kg (136 lb 1.6 oz), SpO2 94 %.  I/O:   Intake/Output Summary (Last 24 hours) at 04/17/15 0850 Last data filed at  04/17/15 0813  Gross per 24 hour  Intake 848.75 ml  Output   1000 ml  Net -151.25 ml    PHYSICAL EXAMINATION:  GENERAL:  81 y.o.-year-old patient lying in the bed with no acute distress.  EYES: Pupils equal, round, reactive to light and accommodation. No scleral icterus. Extraocular muscles intact.  HEENT: Head atraumatic, normocephalic. Oropharynx clear. Left nostril congested NECK:  Supple, no jugular venous distention. No thyroid enlargement, no tenderness.  LUNGS: Decreased breath sounds bilaterally, no wheezing, rales,rhonchi or crepitation. No use of accessory muscles of respiration.  CARDIOVASCULAR: S1, S2 normal. 2/6 systolic murmur, no rubs, or gallops.  ABDOMEN: Soft, non-tender, non-distended. Bowel sounds present. No organomegaly or mass.  EXTREMITIES: 2+ pedal edema, no cyanosis, or clubbing.  NEUROLOGIC: Cranial nerves II through XII are intact. Muscle strength 5/5 in all extremities. Sensation intact. Gait not checked.  PSYCHIATRIC: The patient is alert and oriented x 3.  SKIN: No obvious rash, lesion, or ulcer.   DATA REVIEW:   CBC  Recent Labs Lab 04/15/15 2137  WBC 5.0  HGB 12.0  HCT 33.8*  PLT 182    Chemistries   Recent Labs Lab 04/15/15 2137  04/17/15 0354  NA  --   < > 132*  K  --   < > 3.8  CL  --   < > 101  CO2  --   < > 27  GLUCOSE  --   < > 88  BUN  --   < > 13  CREATININE  --   < > 0.50  CALCIUM  --   < > 8.4*  MG 1.7  --   --   < > = values in this interval not displayed.  Cardiac Enzymes  Recent Labs Lab 04/16/15 1459  TROPONINI 0.03    Microbiology Results  Results for orders placed or performed during the hospital encounter of 10/23/14  Surgical pcr screen     Status: None   Collection Time: 10/23/14 11:24 AM  Result Value Ref Range Status   MRSA, PCR NEGATIVE NEGATIVE Final   Staphylococcus aureus NEGATIVE NEGATIVE Final    Comment:        The Xpert SA Assay (FDA approved for NASAL specimens in patients over 21 years  of age), is one component of a comprehensive surveillance program.  Test performance has been validated by [facility] for patients greater than or equal to 88 year old. It is not intended to diagnose infection nor to guide or monitor treatment.     RADIOLOGY:  Dg Chest 2 View  04/15/2015  CLINICAL DATA:  Cough, elevated blood pressure EXAM: CHEST  2 VIEW COMPARISON:  04/04/2015 FINDINGS: There is bilateral mild interstitial thickening, likely chronic. There is no focal parenchymal opacity. There is no pleural effusion or pneumothorax. The heart and mediastinal contours are unremarkable. There is a dual lead cardiac pacemaker. The osseous structures are unremarkable. IMPRESSION: No active cardiopulmonary disease. Electronically Signed   By: Elige Ko   On: 04/15/2015 22:25    Management plans discussed with the patient, and she is in agreement.  CODE STATUS:     Code Status Orders        Start     Ordered   04/16/15 0102  Full code   Continuous     04/16/15 0102    Advance Directive Documentation        Most Recent Value   Type of Advance Directive  Living will   Pre-existing out of facility DNR order (yellow form or pink MOST form)     "MOST" Form in Place?        TOTAL TIME TAKING CARE OF THIS PATIENT: 35 minutes.    Alford Highland M.D on 04/17/2015 at 8:50 AM  Between 7am to 6pm - Pager - [phone]  After 6pm go to [email or web address] - password [email or web address]  [facility]  Office  [phone]  CC: Primary care physician; Lauro Regulus., MD

## 2015-04-17 NOTE — Care Management (Signed)
No discharge needs. 

## 2015-04-17 NOTE — Evaluation (Signed)
Physical Therapy Evaluation Patient Details Name: Natalie KapurHelen S Schneider MRN: [id number] DOB: [birth year] Today's Date: 04/17/2015   History of Present Illness  Pt is an 81 y/o female that presents after feeling light-headed and nauseated while sitting in the chair. Pt recently admitted for pacemaker placement. [facility] and denies any falls recently.   Clinical Impression  Patient recently discharged after pacemaker placement, she returns with light-headedness complaints. On this evaluation no complaints offered by patient and during OOB mobility she demonstrates her baseline level of gait speed, safety, and LE strength. She typically ambulates with a SPC, though PT offers a RW for this session. No loss of balance noted during this session. She appears to be at her baseline, she could benefit from outpatient PT services for higher level LE strengthening and balance if she chooses, otherwise no deficits noted.     Follow Up Recommendations Outpatient PT (If patient chooses to work on increased dynamic balance)    Equipment Recommendations   (Pt has appropriate equipment at home. )    Recommendations for Other Services       Precautions / Restrictions Precautions Precautions: None Restrictions Weight Bearing Restrictions: No      Mobility  Bed Mobility Overal bed mobility: Modified Independent             General bed mobility comments: HOB elevated, uses hands to complete transfer.   Transfers Overall transfer level: Modified independent Equipment used: Rolling walker (2 wheeled)             General transfer comment: Appropriate speed and hand placement noted with transfer, no loss of balance noted.   Ambulation/Gait Ambulation/Gait assistance: Modified independent (Device/Increase time) Ambulation Distance (Feet): 200 Feet Assistive device: Rolling walker (2 wheeled) Gait Pattern/deviations: WFL(Within Functional Limits)   Gait velocity  interpretation: at or above normal speed for age/gender General Gait Details: Patient demonstrates reciprocal gait pattern, no complaints or dizziness noted during ambulation. No signs or symptoms of any distress noted.   Stairs            Wheelchair Mobility    Modified Rankin (Stroke Patients Only)       Balance Overall balance assessment: Modified Independent                                           Pertinent Vitals/Pain Pain Assessment: No/denies pain    Home Living Family/patient expects to be discharged to:: Private residence Living Arrangements: Children;Other relatives Available Help at Discharge: Family Type of Home: House Home Access: Stairs to enter Entrance Stairs-Rails: Right Entrance Stairs-Number of Steps: 3 Home Layout: One level Home Equipment: Walker - 2 wheels;Cane - single point      Prior Function Level of Independence: Independent with assistive device(s)         Comments: Indep [facility]; enjoys working outside in garden.  Denies fall history.     Hand Dominance        Extremity/Trunk Assessment   Upper Extremity Assessment: Overall WFL for tasks assessed           Lower Extremity Assessment: Overall WFL for tasks assessed         Communication   Communication: No difficulties  Cognition Arousal/Alertness: Awake/alert Behavior During Therapy: WFL for tasks assessed/performed Overall Cognitive Status: Within Functional Limits for tasks assessed  General Comments General comments (skin integrity, edema, etc.): Modified DGI of 11/12, minimal deficits with head turns noted.     Exercises        Assessment/Plan    PT Assessment Patent does not need any further PT services  PT Diagnosis     PT Problem List    PT Treatment Interventions     PT Goals (Current goals can be found in the Care Plan section) Acute Rehab PT Goals Patient  Stated Goal: To return home safely.  PT Goal Formulation: With patient Time For Goal Achievement: 05/01/15 Potential to Achieve Goals: Good    Frequency     Barriers to discharge        Co-evaluation               End of Session Equipment Utilized During Treatment: Gait belt Activity Tolerance: Patient tolerated treatment well Patient left: in bed;with call bell/phone within reach;with family/visitor present Nurse Communication: Mobility status    Functional Assessment Tool Used: Modified DGI, clinical judgement  Functional Limitation: Mobility: Walking and moving around Mobility: Walking and Moving Around Current Status (Z6109): At least 1 percent but less than 20 percent impaired, limited or restricted Mobility: Walking and Moving Around Discharge Status [phone]): At least 1 percent but less than 20 percent impaired, limited or restricted    Time: 1041-1050 PT Time Calculation (min) (ACUTE ONLY): 9 min   Charges:   PT Evaluation $Initial PT Evaluation Tier I: 1 Procedure     PT G Codes:   PT G-Codes **NOT FOR INPATIENT CLASS** Functional Assessment Tool Used: Modified DGI, clinical judgement  Functional Limitation: Mobility: Walking and moving around Mobility: Walking and Moving Around Current Status (U9811): At least 1 percent but less than 20 percent impaired, limited or restricted Mobility: Walking and Moving Around Discharge Status [phone]): At least 1 percent but less than 20 percent impaired, limited or restricted   Kerin Ransom, PT, DPT    04/17/2015, 1:03 PM

## 2015-04-17 NOTE — Discharge Instructions (Addendum)
patient states she was started on Eliquis which she can continue   Near-Syncope Near-syncope (commonly known as near fainting) is sudden weakness, dizziness, or feeling like you might pass out. This can happen when getting up or while standing for a long time. It is caused by a sudden decrease in blood flow to the brain, which can occur for various reasons. Most of the reasons are not serious.  HOME CARE Watch your condition for any changes.  Have someone stay with you until you feel stable.  If you feel like you are going to pass out:  Lie down right away.  Prop your feet up if you can.  Breathe deeply and steadily.  Move only when the feeling has gone away. Most of the time, this feeling lasts only a few minutes. You may feel tired for several hours.  Drink enough fluids to keep your pee (urine) clear or pale yellow.  If you are taking blood pressure or heart medicine, stand up slowly.  Follow up with your doctor as told. GET HELP RIGHT AWAY IF:   You have a severe headache.  You have unusual pain in the chest, belly (abdomen), or back.  You have bleeding from the mouth or butt (rectum), or you have black or tarry poop (stool).  You feel your heart beat differently than normal, or you have a very fast pulse.  You pass out, or you twitch and shake when you pass out.  You pass out when sitting or lying down.  You feel confused.  You have trouble walking.  You are weak.  You have vision problems. MAKE SURE YOU:   Understand these instructions.  Will watch your condition.  Will get help right away if you are not doing well or get worse.   This information is not intended to replace advice given to you by your health care provider. Make sure you discuss any questions you have with your health care provider.   Document Released: 10/19/2007 Document Revised: 05/23/2014 Document Reviewed: 10/05/2012 Elsevier Interactive Patient Education [employer]2016 [employer]

## 2015-04-17 NOTE — Care Management (Signed)
Patient for discharge home today.  There are no home health needs identified.

## 2015-05-01 DIAGNOSIS — J42 Unspecified chronic bronchitis: Secondary | ICD-10-CM | POA: Diagnosis not present

## 2015-05-21 DIAGNOSIS — Z96652 Presence of left artificial knee joint: Secondary | ICD-10-CM | POA: Diagnosis not present

## 2015-06-03 DIAGNOSIS — I10 Essential (primary) hypertension: Secondary | ICD-10-CM | POA: Diagnosis not present

## 2015-06-03 DIAGNOSIS — I495 Sick sinus syndrome: Secondary | ICD-10-CM | POA: Diagnosis not present

## 2015-06-03 DIAGNOSIS — I480 Paroxysmal atrial fibrillation: Secondary | ICD-10-CM | POA: Diagnosis not present

## 2015-06-03 DIAGNOSIS — I5022 Chronic systolic (congestive) heart failure: Secondary | ICD-10-CM | POA: Diagnosis not present

## 2015-06-08 DIAGNOSIS — L309 Dermatitis, unspecified: Secondary | ICD-10-CM | POA: Diagnosis not present

## 2015-06-25 DIAGNOSIS — Z23 Encounter for immunization: Secondary | ICD-10-CM | POA: Diagnosis not present

## 2015-06-25 DIAGNOSIS — S81811A Laceration without foreign body, right lower leg, initial encounter: Secondary | ICD-10-CM | POA: Diagnosis not present

## 2015-06-28 DIAGNOSIS — S81811D Laceration without foreign body, right lower leg, subsequent encounter: Secondary | ICD-10-CM | POA: Diagnosis not present

## 2015-07-01 DIAGNOSIS — R600 Localized edema: Secondary | ICD-10-CM | POA: Diagnosis not present

## 2015-07-01 DIAGNOSIS — S81811D Laceration without foreign body, right lower leg, subsequent encounter: Secondary | ICD-10-CM | POA: Diagnosis not present

## 2015-07-07 DIAGNOSIS — I480 Paroxysmal atrial fibrillation: Secondary | ICD-10-CM | POA: Diagnosis not present

## 2015-07-07 DIAGNOSIS — I495 Sick sinus syndrome: Secondary | ICD-10-CM | POA: Diagnosis not present

## 2015-07-07 DIAGNOSIS — I5022 Chronic systolic (congestive) heart failure: Secondary | ICD-10-CM | POA: Diagnosis not present

## 2015-07-07 DIAGNOSIS — I714 Abdominal aortic aneurysm, without rupture: Secondary | ICD-10-CM | POA: Diagnosis not present

## 2015-07-10 DIAGNOSIS — L309 Dermatitis, unspecified: Secondary | ICD-10-CM | POA: Diagnosis not present

## 2015-07-10 DIAGNOSIS — N3945 Continuous leakage: Secondary | ICD-10-CM | POA: Diagnosis not present

## 2015-07-10 DIAGNOSIS — S81811D Laceration without foreign body, right lower leg, subsequent encounter: Secondary | ICD-10-CM | POA: Diagnosis not present

## 2015-07-10 DIAGNOSIS — N762 Acute vulvitis: Secondary | ICD-10-CM | POA: Diagnosis not present

## 2015-07-17 DIAGNOSIS — L309 Dermatitis, unspecified: Secondary | ICD-10-CM | POA: Diagnosis not present

## 2015-07-17 DIAGNOSIS — R600 Localized edema: Secondary | ICD-10-CM | POA: Diagnosis not present

## 2015-07-17 DIAGNOSIS — S81811A Laceration without foreign body, right lower leg, initial encounter: Secondary | ICD-10-CM | POA: Diagnosis not present

## 2015-08-18 DIAGNOSIS — J40 Bronchitis, not specified as acute or chronic: Secondary | ICD-10-CM | POA: Diagnosis not present

## 2015-08-18 DIAGNOSIS — J42 Unspecified chronic bronchitis: Secondary | ICD-10-CM | POA: Diagnosis not present

## 2015-09-10 DIAGNOSIS — M79674 Pain in right toe(s): Secondary | ICD-10-CM | POA: Diagnosis not present

## 2015-09-10 DIAGNOSIS — S92511A Displaced fracture of proximal phalanx of right lesser toe(s), initial encounter for closed fracture: Secondary | ICD-10-CM | POA: Diagnosis not present

## 2015-09-10 DIAGNOSIS — J441 Chronic obstructive pulmonary disease with (acute) exacerbation: Secondary | ICD-10-CM | POA: Diagnosis not present

## 2015-09-10 DIAGNOSIS — R05 Cough: Secondary | ICD-10-CM | POA: Diagnosis not present

## 2015-09-16 DIAGNOSIS — S92514A Nondisplaced fracture of proximal phalanx of right lesser toe(s), initial encounter for closed fracture: Secondary | ICD-10-CM | POA: Diagnosis not present

## 2015-10-06 DIAGNOSIS — M79671 Pain in right foot: Secondary | ICD-10-CM | POA: Diagnosis not present

## 2015-10-06 IMAGING — CR DG KNEE 1-2V*L*
1 series · 2 of 2 positions shown · non-contrast
Comparison: None.

CLINICAL DATA: Status post left knee replacement

EXAM:
LEFT KNEE - 1-2 VIEW

[Series 1: lat · 0.17mm/px · 2 of 2 slices shown]
[im 1/2]
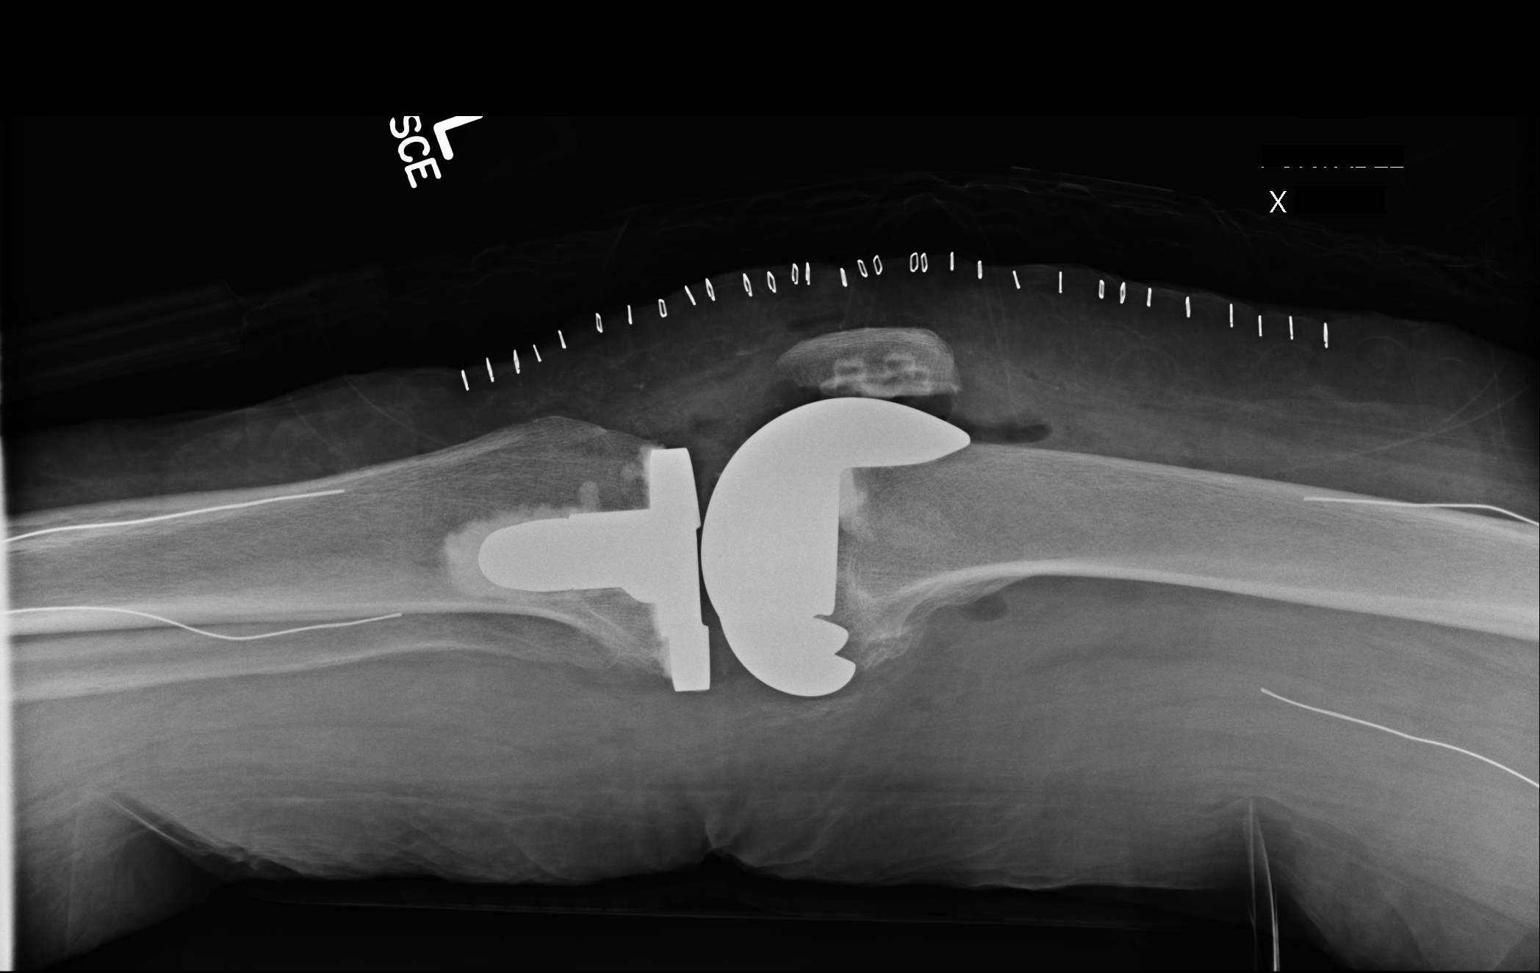
[im 2/2]
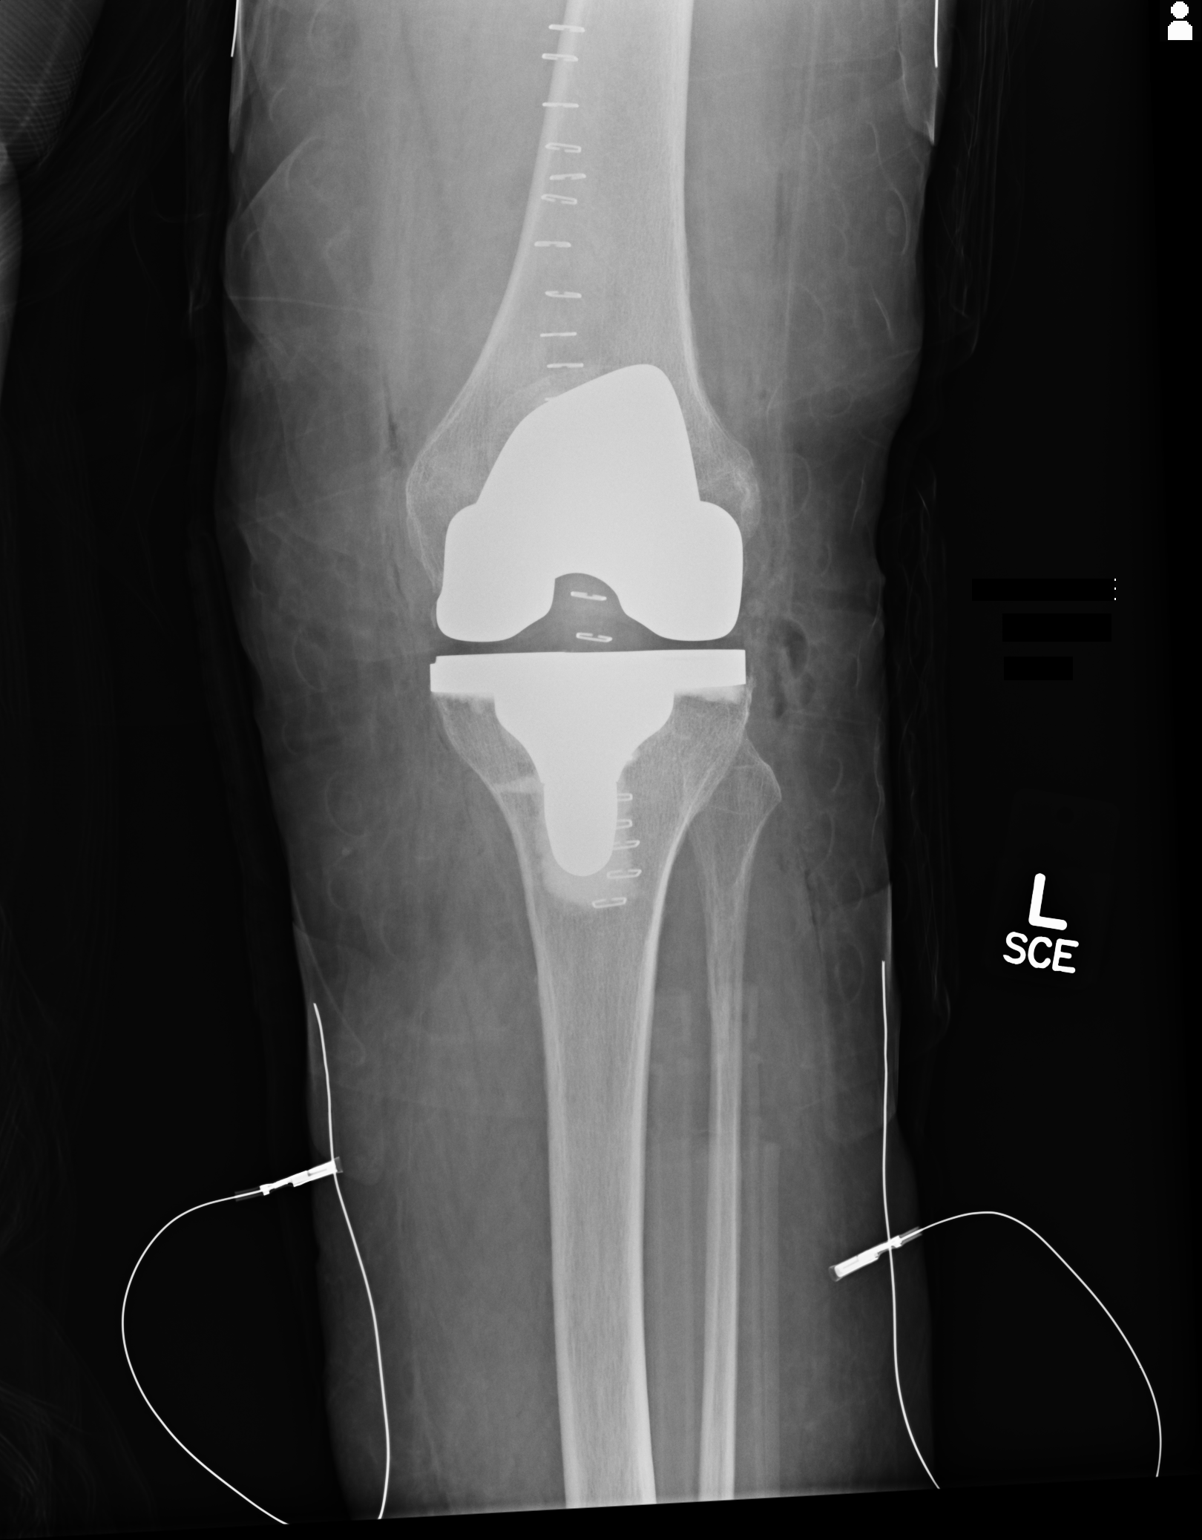

[2 of 2 positions shown; findings below may reference images not displayed]

FINDINGS: A left knee replacement is seen. Air is noted within the operative
bed. No acute bony or soft tissue abnormality is seen.
IMPRESSION: Status post left knee replacement.  No acute abnormality is noted.

## 2015-10-21 DIAGNOSIS — J42 Unspecified chronic bronchitis: Secondary | ICD-10-CM | POA: Diagnosis not present

## 2015-10-21 DIAGNOSIS — I10 Essential (primary) hypertension: Secondary | ICD-10-CM | POA: Diagnosis not present

## 2015-11-26 DIAGNOSIS — Z96652 Presence of left artificial knee joint: Secondary | ICD-10-CM | POA: Diagnosis not present

## 2015-12-02 DIAGNOSIS — L03116 Cellulitis of left lower limb: Secondary | ICD-10-CM | POA: Diagnosis not present

## 2015-12-22 DIAGNOSIS — I495 Sick sinus syndrome: Secondary | ICD-10-CM | POA: Diagnosis not present

## 2016-01-12 DIAGNOSIS — N939 Abnormal uterine and vaginal bleeding, unspecified: Secondary | ICD-10-CM | POA: Diagnosis not present

## 2016-01-14 ENCOUNTER — Other Ambulatory Visit: Payer: Self-pay | Admitting: Internal Medicine

## 2016-01-14 DIAGNOSIS — N939 Abnormal uterine and vaginal bleeding, unspecified: Secondary | ICD-10-CM

## 2016-01-19 ENCOUNTER — Ambulatory Visit: Payer: Commercial Managed Care - HMO

## 2016-01-19 ENCOUNTER — Ambulatory Visit
Admission: RE | Admit: 2016-01-19 | Discharge: 2016-01-19 | Disposition: A | Discharge status: Home / Self Care | Payer: Commercial Managed Care - HMO | Source: Ambulatory Visit | Attending: Internal Medicine | Admitting: Internal Medicine

## 2016-01-19 DIAGNOSIS — N939 Abnormal uterine and vaginal bleeding, unspecified: Secondary | ICD-10-CM | POA: Insufficient documentation

## 2016-01-19 DIAGNOSIS — N858 Other specified noninflammatory disorders of uterus: Secondary | ICD-10-CM | POA: Diagnosis not present

## 2016-01-19 DIAGNOSIS — N959 Unspecified menopausal and perimenopausal disorder: Secondary | ICD-10-CM | POA: Diagnosis not present

## 2016-01-26 DIAGNOSIS — K625 Hemorrhage of anus and rectum: Secondary | ICD-10-CM | POA: Diagnosis not present

## 2016-01-26 DIAGNOSIS — J42 Unspecified chronic bronchitis: Secondary | ICD-10-CM | POA: Diagnosis not present

## 2016-01-26 DIAGNOSIS — I5022 Chronic systolic (congestive) heart failure: Secondary | ICD-10-CM | POA: Diagnosis not present

## 2016-01-26 DIAGNOSIS — I10 Essential (primary) hypertension: Secondary | ICD-10-CM | POA: Diagnosis not present

## 2016-02-08 DIAGNOSIS — N950 Postmenopausal bleeding: Secondary | ICD-10-CM | POA: Diagnosis not present

## 2016-02-08 DIAGNOSIS — N841 Polyp of cervix uteri: Secondary | ICD-10-CM | POA: Diagnosis not present

## 2016-02-12 DIAGNOSIS — N858 Other specified noninflammatory disorders of uterus: Secondary | ICD-10-CM | POA: Diagnosis not present

## 2016-02-12 DIAGNOSIS — N950 Postmenopausal bleeding: Secondary | ICD-10-CM | POA: Diagnosis not present

## 2016-02-12 DIAGNOSIS — N841 Polyp of cervix uteri: Secondary | ICD-10-CM | POA: Diagnosis not present

## 2016-02-29 DIAGNOSIS — I5022 Chronic systolic (congestive) heart failure: Secondary | ICD-10-CM | POA: Diagnosis not present

## 2016-02-29 DIAGNOSIS — I480 Paroxysmal atrial fibrillation: Secondary | ICD-10-CM | POA: Diagnosis not present

## 2016-02-29 DIAGNOSIS — J42 Unspecified chronic bronchitis: Secondary | ICD-10-CM | POA: Diagnosis not present

## 2016-03-07 DIAGNOSIS — Z961 Presence of intraocular lens: Secondary | ICD-10-CM | POA: Diagnosis not present

## 2016-03-07 DIAGNOSIS — H52213 Irregular astigmatism, bilateral: Secondary | ICD-10-CM | POA: Diagnosis not present

## 2016-03-22 ENCOUNTER — Encounter: Payer: Self-pay | Admitting: *Deleted

## 2016-03-22 ENCOUNTER — Emergency Department
Admission: EM | Admit: 2016-03-22 | Discharge: 2016-03-22 | Disposition: A | Discharge status: Home / Self Care | Payer: Commercial Managed Care - HMO | Attending: Emergency Medicine | Admitting: Emergency Medicine

## 2016-03-22 ENCOUNTER — Emergency Department: Payer: Commercial Managed Care - HMO

## 2016-03-22 DIAGNOSIS — Z950 Presence of cardiac pacemaker: Secondary | ICD-10-CM | POA: Insufficient documentation

## 2016-03-22 DIAGNOSIS — R112 Nausea with vomiting, unspecified: Secondary | ICD-10-CM | POA: Diagnosis not present

## 2016-03-22 DIAGNOSIS — Z87891 Personal history of nicotine dependence: Secondary | ICD-10-CM | POA: Diagnosis not present

## 2016-03-22 DIAGNOSIS — J449 Chronic obstructive pulmonary disease, unspecified: Secondary | ICD-10-CM | POA: Insufficient documentation

## 2016-03-22 DIAGNOSIS — Z7951 Long term (current) use of inhaled steroids: Secondary | ICD-10-CM | POA: Insufficient documentation

## 2016-03-22 DIAGNOSIS — J45909 Unspecified asthma, uncomplicated: Secondary | ICD-10-CM | POA: Insufficient documentation

## 2016-03-22 DIAGNOSIS — I110 Hypertensive heart disease with heart failure: Secondary | ICD-10-CM | POA: Diagnosis not present

## 2016-03-22 DIAGNOSIS — Z791 Long term (current) use of non-steroidal anti-inflammatories (NSAID): Secondary | ICD-10-CM | POA: Diagnosis not present

## 2016-03-22 DIAGNOSIS — R51 Headache: Secondary | ICD-10-CM | POA: Insufficient documentation

## 2016-03-22 DIAGNOSIS — R110 Nausea: Secondary | ICD-10-CM | POA: Insufficient documentation

## 2016-03-22 DIAGNOSIS — R42 Dizziness and giddiness: Secondary | ICD-10-CM | POA: Insufficient documentation

## 2016-03-22 DIAGNOSIS — Z79899 Other long term (current) drug therapy: Secondary | ICD-10-CM | POA: Insufficient documentation

## 2016-03-22 DIAGNOSIS — I6523 Occlusion and stenosis of bilateral carotid arteries: Secondary | ICD-10-CM | POA: Diagnosis not present

## 2016-03-22 DIAGNOSIS — I509 Heart failure, unspecified: Secondary | ICD-10-CM | POA: Diagnosis not present

## 2016-03-22 HISTORY — DX: Dizziness and giddiness: R42

## 2016-03-22 HISTORY — DX: Unspecified asthma, uncomplicated: J45.909

## 2016-03-22 HISTORY — DX: Heart failure, unspecified: I50.9

## 2016-03-22 LAB — URINALYSIS COMPLETE WITH MICROSCOPIC (ARMC ONLY)
BACTERIA UA: NONE SEEN
Bilirubin Urine: NEGATIVE
Glucose, UA: NEGATIVE mg/dL
Hgb urine dipstick: NEGATIVE
Ketones, ur: NEGATIVE mg/dL
LEUKOCYTES UA: NEGATIVE
Nitrite: NEGATIVE
PH: 8 (ref 5.0–8.0)
PROTEIN: NEGATIVE mg/dL
Specific Gravity, Urine: 1.005 (ref 1.005–1.030)
WBC UA: NONE SEEN WBC/hpf (ref 0–5)

## 2016-03-22 LAB — BASIC METABOLIC PANEL
Anion gap: 7 (ref 5–15)
BUN: 11 mg/dL (ref 6–20)
CHLORIDE: 104 mmol/L (ref 101–111)
CO2: 29 mmol/L (ref 22–32)
Calcium: 9.1 mg/dL (ref 8.9–10.3)
Creatinine, Ser: 0.59 mg/dL (ref 0.44–1.00)
GFR calc Af Amer: >60 mL/min (ref >60)
GFR calc non Af Amer: >60 mL/min (ref >60)
Glucose, Bld: 155 mg/dL — ABNORMAL HIGH (ref 65–99)
POTASSIUM: 3.6 mmol/L (ref 3.5–5.1)
SODIUM: 140 mmol/L (ref 135–145)

## 2016-03-22 LAB — CBC
HEMATOCRIT: 39.1 % (ref 35.0–47.0)
Hemoglobin: 13.9 g/dL (ref 12.0–16.0)
MCH: 33.4 pg (ref 26.0–34.0)
MCHC: 35.5 g/dL (ref 32.0–36.0)
MCV: 94.1 fL (ref 80.0–100.0)
Platelets: 188 10*3/uL (ref 150–440)
RBC: 4.15 MIL/uL (ref 3.80–5.20)
RDW: 12.7 % (ref 11.5–14.5)
WBC: 4.1 10*3/uL (ref 3.6–11.0)

## 2016-03-22 MED ORDER — ONDANSETRON 4 MG PO TBDP: 4.0000 mg | ORAL_TABLET | Freq: Three times a day (TID) | ORAL | 0 refills | Status: AC | PRN

## 2016-03-22 MED ORDER — MECLIZINE HCL 25 MG PO TABS
25.0000 mg | ORAL_TABLET | Freq: Once | ORAL | Status: AC
  Administered 2016-03-22: 25 mg via ORAL
  Filled 2016-03-22: qty 1

## 2016-03-22 MED ORDER — IOPAMIDOL (ISOVUE-370) INJECTION 76%
75.0000 mL | Freq: Once | INTRAVENOUS | Status: AC | PRN
  Administered 2016-03-22: 75 mL via INTRAVENOUS

## 2016-03-22 MED ORDER — MECLIZINE HCL 25 MG PO TABS: 25.0000 mg | ORAL_TABLET | Freq: Three times a day (TID) | ORAL | 0 refills | Status: AC | PRN

## 2016-03-22 MED ORDER — SODIUM CHLORIDE 0.9 % IV BOLUS (SEPSIS)
1000.0000 mL | Freq: Once | INTRAVENOUS | Status: AC
  Administered 2016-03-22: 1000 mL via INTRAVENOUS

## 2016-03-22 MED ORDER — ONDANSETRON HCL 4 MG/2ML IJ SOLN
4.0000 mg | Freq: Once | INTRAMUSCULAR | Status: AC
  Administered 2016-03-22: 4 mg via INTRAVENOUS
  Filled 2016-03-22: qty 2

## 2016-03-22 NOTE — Discharge Instructions (Signed)
Please drink plenty of fluid to stay well hydrated. If you develop dizziness, please take meclizine. Zofran is for nausea.  Return to the emergency department if you develop severe pain, worsening dizziness, nausea or vomiting, visual or speech changes, changes in mental status, numbness tingling or weakness, difficulty walking, or any other symptoms concerning to you.

## 2016-03-22 NOTE — ED Notes (Signed)
Patient able to maintain posture unassisted while standing. Patient c/o "a little bit" of dizziness while standing.

## 2016-03-22 NOTE — ED Triage Notes (Signed)
Per EMS report, patient woke at 4am c/o vertigo/dizzness/weakness and was incontinent of urine. This morning, patient's family was unable to stand the patient up due to dizziness/vertigo. Patient c/o nausea/dizziness/vertigo with position changes. Patient states she has had a recurrent cough that causes vomiting and does have a hx. Of  Incontinence. Patient is alert and oriented upon arrival.

## 2016-03-22 NOTE — ED Provider Notes (Signed)
[facility] Emergency Department Provider Note  ____________________________________________  Time seen: Approximately 1:46 PM  I have reviewed the triage vital signs and the nursing notes.   HISTORY  Chief Complaint Weakness    HPI Natalie Schneider is a 82 y.o. female with a history of A. fib status post pacemaker, COPD, HTN, vertigo presenting with dizziness. The patient reports that when she woke up this morning she immediately began to feel dizzy "like the lights were spinning." This was associated with nausea but no vomiting. She was unable to get out of bed due to the dizziness. She describes a mild associated headache, but this started later and she attributes this to not having eaten breakfast. No numbness tingling or weakness, visual changes, speech changes, or changes in mental status. Her daughter also reports an episode of urinating in the bed, questionable incontinence. No recent URI symptoms; the patient is a chronic cough which is unchanged. Recent changes in medications or trauma.   Past Medical History:  Diagnosis Date  . Arthritis   . Asthma   . Atrial fibrillation (HCC)   . CHF (congestive heart failure) (HCC)   . COPD (chronic obstructive pulmonary disease) (HCC)   . Cough   . Environmental allergies   . GERD (gastroesophageal reflux disease)   . Hypertension   . Vertigo     Patient Active Problem List   Diagnosis Date Noted  . Near syncope 04/16/2015  . Heart block 04/02/2015  . Complete heart block (HCC) 04/01/2015  . S/P total knee replacement 11/05/2014    Past Surgical History:  Procedure Laterality Date  . BREAST BIOPSY Left   . cataracts Bilateral   . LAPAROSCOPIC OOPHERECTOMY Bilateral   . PACEMAKER INSERTION Left 04/03/2015   Procedure: INSERTION PACEMAKER;  Surgeon: Sharion Settler, MD;  Location: [facility] ORS;  Service: Cardiovascular;  Laterality: Left;  . THYROID SURGERY    . TOTAL KNEE ARTHROPLASTY Left 11/05/2014    Procedure: TOTAL KNEE ARTHROPLASTY;  Surgeon: Erin Sons, MD;  Location: [facility] ORS;  Service: Orthopedics;  Laterality: Left;    Current Outpatient Rx  . Order #: [id number] Class: OTC  . Order #: [id number] Class: Historical Med  . Order #: [id number] Class: Historical Med  . Order #: [id number] Class: Historical Med  . Order #: [id number] Class: Historical Med  . Order #: [id number] Class: Historical Med  . Order #: [id number] Class: Historical Med  . Order #: [id number] Class: Historical Med  . Order #: [id number] Class: Historical Med  . Order #: [id number] Class: Print  . Order #: [id number] Class: Print  . Order #: [id number] Class: Historical Med  . Order #: [id number] Class: Historical Med  . Order #: [id number] Class: Historical Med  . Order #: [id number] Class: Print  . Order #: [id number] Class: Print    Allergies Amoxicillin; Augmentin [amoxicillin-pot clavulanate]; Ciprofloxacin; Clindamycin/lincomycin; Dicyclomine; Doxycycline; Flagyl [metronidazole]; Levaquin [levofloxacin in d5w]; and Septra [sulfamethoxazole-trimethoprim]  Family History  Problem Relation Age of Onset  . Heart disease Mother   . Heart disease Father   . Cancer Sister   . Cancer Brother     Social History Social History  Substance Use Topics  . Smoking status: Former Smoker    Quit date: 10/23/1974  . Smokeless tobacco: Never Used  . Alcohol use No    Review of Systems Constitutional: No fever/chills.Positive dizziness. Negative syncope. Eyes: No visual changes. No blurred or double vision. ENT: No sore throat. No congestion or rhinorrhea. Cardiovascular: Denies chest pain. Denies palpitations. Respiratory: Denies shortness of breath.  No cough. Gastrointestinal: No  abdominal pain.  Positive nausea, no vomiting.  No diarrhea.  No constipation. Genitourinary: Negative for dysuria. Positive urination in the bed. Musculoskeletal: Negative for back pain. Skin: Negative for rash. Neurological: Positive for mild  headache. No focal numbness, tingling or weakness. No visual changes or speech changes. No changes in mental status. Positive dizziness  10-point ROS otherwise negative.  ____________________________________________   PHYSICAL EXAM:  VITAL SIGNS: ED Triage Vitals [03/22/16 1326]  Enc Vitals Group     BP (!) 170/79     Pulse Rate 71     Resp 18     Temp 98.4 F (36.9 C)     Temp Source Oral     SpO2 95 %     Weight (!) 1401 lb (635.5 kg)     Height 4\' 11"  (1.499 m)     Head Circumference      Peak Flow      Pain Score      Pain Loc      Pain Edu?      Excl. in GC?     Constitutional: Alert and oriented. Well appearing and in no acute distress. Answers questions appropriately. Eyes: Conjunctivae are normal.  EOMI. PERRLA. No scleral icterus. Head: Atraumatic. Nose: No congestion/rhinnorhea. Mouth/Throat: Mucous membranes are moist.  Neck: No stridor.  Supple.  No JVD. Cardiovascular: Normal rate, regular rhythm. No murmurs, rubs or gallops.  Respiratory: Normal respiratory effort.  No accessory muscle use or retractions. Lungs CTAB.  No wheezes, rales or ronchi. Gastrointestinal: Soft, nontender and nondistended.  No guarding or rebound.  No peritoneal signs. Musculoskeletal: No LE edema. No ttp in the calves or palpable cords.  Negative Homan's sign. Neurologic: Alert and oriented 3. Speech is clear.  Face and smile symmetric. Tongue is midline. EOMI and PERRLA. No vertical or horizontal nystagmus. No pronator drift. 5 out of 5 grip, biceps, triceps, hip flexors, plantar flexion and dorsiflexion. Normal sensation to light touch in the bilateral upper and lower extremities, and face.  Skin:  Skin is warm, dry and intact. No rash noted. Psychiatric: Mood and affect are normal. Speech and behavior are normal.  Normal judgement.  ____________________________________________   LABS (all labs ordered are listed, but only abnormal results are displayed)  Labs Reviewed  BASIC  METABOLIC PANEL - Abnormal; Notable for the following:       Result Value   Glucose, Bld 155 (*)    All other components within normal limits  URINALYSIS COMPLETEWITH MICROSCOPIC ([facility] ONLY) - Abnormal; Notable for the following:    Color, Urine STRAW (*)    APPearance CLEAR (*)    Squamous Epithelial / LPF 0-5 (*)    All other components within normal limits  CBC  CBG MONITORING, ED   ____________________________________________  EKG  ED ECG REPORT I, Rockne MenghiniNorman, Anne-Caroline, the attending physician, personally viewed and interpreted this ECG.   Date: 03/22/2016  EKG Time: 1338  Rate: 69  Rhythm: Paced  Axis: Paced  Intervals:Paced  ST&T Change: Paced  ____________________________________________  RADIOLOGY  Ct Angio Head W Or Wo Contrast  Result Date: 03/22/2016 CLINICAL DATA:  82 y/o F; vertigo, dizziness, and weakness with urinary incontinence. EXAM: CT ANGIOGRAPHY HEAD AND NECK TECHNIQUE: Multidetector CT imaging of the head and neck was performed using the standard protocol during bolus administration of intravenous contrast. Multiplanar CT image reconstructions and MIPs were obtained to evaluate the vascular anatomy. Carotid stenosis measurements (when applicable) are obtained utilizing NASCET criteria, using the distal internal carotid  diameter as the denominator. CONTRAST:  75 cc Isovue 370 COMPARISON:  11/17/2006 CT head FINDINGS: CT HEAD FINDINGS Brain: No evidence of large acute infarct, intracranial hemorrhage, or focal mass effect. No hydrocephalus. No effacement of basilar cisterns. Few foci of hypoattenuation in periventricular white matter are consistent with mild chronic microvascular ischemic changes. Mild parenchymal volume loss. Vascular: See below. Skull: Normal. Negative for fracture or focal lesion. Sinuses: Left sphenoid sinus mucosal thickening. Right mastoid sclerosis and partial opacification consistent with sequelae of chronic otomastoiditis. Partial  opacification of external auditory canals likely representing cerumen. Orbits: Visualized orbits are unremarkable. Review of the MIP images confirms the above findings CTA NECK FINDINGS Aortic arch: Standard branching. Imaged portion shows no evidence of aneurysm or dissection. No significant stenosis of the major arch vessel origins. Right carotid system: No evidence of dissection, stenosis (50% or greater) or occlusion. Mild beaded irregularity of the upper cervical segment may be related to atherosclerosis or sequelae of fibromuscular dysplasia (series 11, image 80). Minimal calcified plaque of the bifurcation. Left carotid system: No evidence of dissection, stenosis (50% or greater) or occlusion. Minimal calcified plaque of the bifurcation. Vertebral arteries: Codominant. No evidence of dissection, stenosis (50% or greater) or occlusion. Skeleton: Mild multilevel cervical degenerative changes greatest at the C4 do C6 levels. No high-grade bony canal stenosis or foraminal narrowing. Other neck: Negative. Upper chest: Right upper lobe 3 mm nodule (series 10, image 280). Review of the MIP images confirms the above findings CTA HEAD FINDINGS Anterior circulation: No significant stenosis, proximal vessel occlusion, or vascular malformation. 2 mm laterally directed outpouching of distal right cavernous ICA (series 10, image 104) may represent a small aneurysm or infundibular origin of diminutive vessel. Posterior circulation: No significant stenosis, proximal occlusion, aneurysm, or vascular malformation. Venous sinuses: As permitted by contrast timing, patent. Anatomic variants: Anterior communicating artery, large right posterior communicating artery, and tiny left posterior communicating arteries are present. Delayed phase: No abnormal intracranial enhancement. Review of the MIP images confirms the above findings IMPRESSION: 1. No significant stenosis, proximal vessel occlusion, or vascular malformation of the  circle of Willis. 2. 2 mm laterally directed outpouching of distal right cavernous ICA may represent a tiny saccular aneurysm or infundibular origin of diminutive vessel. 3. No evidence of dissection, hemodynamically significant stenosis, or occlusion of the carotid or vertebral arteries of the neck. Minimal calcified plaque of carotid bifurcations. 4. Right upper cervical ICA beaded appearance may represent atherosclerotic disease or sequelae of fibromuscular dysplasia. 5. 3 mm right upper lobe pulmonary nodule. No follow-up needed if patient is low-risk. Non-contrast chest CT can be considered in 12 months if patient is high-risk. This recommendation follows the consensus statement: Guidelines for Management of Incidental Pulmonary Nodules Detected on CT Images: From the Fleischner Society 2017; Radiology 2017; 284:228-243. Electronically Signed   By: Mitzi Hansen M.D.   On: 03/22/2016 15:11   Ct Angio Neck W And/or Wo Contrast  Result Date: 03/22/2016 CLINICAL DATA:  82 y/o F; vertigo, dizziness, and weakness with urinary incontinence. EXAM: CT ANGIOGRAPHY HEAD AND NECK TECHNIQUE: Multidetector CT imaging of the head and neck was performed using the standard protocol during bolus administration of intravenous contrast. Multiplanar CT image reconstructions and MIPs were obtained to evaluate the vascular anatomy. Carotid stenosis measurements (when applicable) are obtained utilizing NASCET criteria, using the distal internal carotid diameter as the denominator. CONTRAST:  75 cc Isovue 370 COMPARISON:  11/17/2006 CT head FINDINGS: CT HEAD FINDINGS Brain: No evidence of large acute  infarct, intracranial hemorrhage, or focal mass effect. No hydrocephalus. No effacement of basilar cisterns. Few foci of hypoattenuation in periventricular white matter are consistent with mild chronic microvascular ischemic changes. Mild parenchymal volume loss. Vascular: See below. Skull: Normal. Negative for fracture or  focal lesion. Sinuses: Left sphenoid sinus mucosal thickening. Right mastoid sclerosis and partial opacification consistent with sequelae of chronic otomastoiditis. Partial opacification of external auditory canals likely representing cerumen. Orbits: Visualized orbits are unremarkable. Review of the MIP images confirms the above findings CTA NECK FINDINGS Aortic arch: Standard branching. Imaged portion shows no evidence of aneurysm or dissection. No significant stenosis of the major arch vessel origins. Right carotid system: No evidence of dissection, stenosis (50% or greater) or occlusion. Mild beaded irregularity of the upper cervical segment may be related to atherosclerosis or sequelae of fibromuscular dysplasia (series 11, image 80). Minimal calcified plaque of the bifurcation. Left carotid system: No evidence of dissection, stenosis (50% or greater) or occlusion. Minimal calcified plaque of the bifurcation. Vertebral arteries: Codominant. No evidence of dissection, stenosis (50% or greater) or occlusion. Skeleton: Mild multilevel cervical degenerative changes greatest at the C4 do C6 levels. No high-grade bony canal stenosis or foraminal narrowing. Other neck: Negative. Upper chest: Right upper lobe 3 mm nodule (series 10, image 280). Review of the MIP images confirms the above findings CTA HEAD FINDINGS Anterior circulation: No significant stenosis, proximal vessel occlusion, or vascular malformation. 2 mm laterally directed outpouching of distal right cavernous ICA (series 10, image 104) may represent a small aneurysm or infundibular origin of diminutive vessel. Posterior circulation: No significant stenosis, proximal occlusion, aneurysm, or vascular malformation. Venous sinuses: As permitted by contrast timing, patent. Anatomic variants: Anterior communicating artery, large right posterior communicating artery, and tiny left posterior communicating arteries are present. Delayed phase: No abnormal  intracranial enhancement. Review of the MIP images confirms the above findings IMPRESSION: 1. No significant stenosis, proximal vessel occlusion, or vascular malformation of the circle of Willis. 2. 2 mm laterally directed outpouching of distal right cavernous ICA may represent a tiny saccular aneurysm or infundibular origin of diminutive vessel. 3. No evidence of dissection, hemodynamically significant stenosis, or occlusion of the carotid or vertebral arteries of the neck. Minimal calcified plaque of carotid bifurcations. 4. Right upper cervical ICA beaded appearance may represent atherosclerotic disease or sequelae of fibromuscular dysplasia. 5. 3 mm right upper lobe pulmonary nodule. No follow-up needed if patient is low-risk. Non-contrast chest CT can be considered in 12 months if patient is high-risk. This recommendation follows the consensus statement: Guidelines for Management of Incidental Pulmonary Nodules Detected on CT Images: From the Fleischner Society 2017; Radiology 2017; 284:228-243. Electronically Signed   By: Mitzi Hansen M.D.   On: 03/22/2016 15:11    ____________________________________________   PROCEDURES  Procedure(s) performed: None  Procedures  Critical Care performed: No ____________________________________________   INITIAL IMPRESSION / ASSESSMENT AND PLAN / ED COURSE  Pertinent labs & imaging results that were available during my care of the patient were reviewed by me and considered in my medical decision making (see chart for details).  82 y.o. female with a history of vertigo, A. fib status post pacemaker, HTN, presenting with dizziness. It is possible that the patient's symptoms are consistent with a bout of vertigo, but given her age and other risk factors, I'm concerned about posterior stroke or intracranial mass, so she will have a CT of the head and CT angiogram of the head and neck. In the meantime, I'll treat her  with meclizine, Zofran, and  intravenous fluids. Will get orthostatics. I will also rule out electrolyte dysfunction, anemia, and UTI is possible causes for her symptoms today.  ----------------------------------------- 3:23 PM on 03/22/2016 -----------------------------------------  The patient's CT does not show any acute abnormality. At this time, she states that her dizziness has completely resolved, and the nurse will obtain orthostatics and evaluate her with standing and positional changes to make sure that her symptoms have completely resolved in order for her to be safely discharged home. The remainder of her laboratory studies are reassuring, including negative urinalysis, normal electrolytes, normal blood counts. Her EKG does not show ischemic changes. At this time the patient is stable for discharge, and I'll discharge her home with meclizine and Zofran for symptomatic treatment. She will follow up with her primary care physician and understands return precautions. ____________________________________________  FINAL CLINICAL IMPRESSION(S) / ED DIAGNOSES  Final diagnoses:  Vertigo    Clinical Course       NEW MEDICATIONS STARTED DURING THIS VISIT:  New Prescriptions   MECLIZINE (ANTIVERT) 25 MG TABLET    Take 1 tablet (25 mg total) by mouth 3 (three) times daily as needed for dizziness.   ONDANSETRON (ZOFRAN ODT) 4 MG DISINTEGRATING TABLET    Take 1 tablet (4 mg total) by mouth every 8 (eight) hours as needed for nausea or vomiting.      Rockne MenghiniAnne-Caroline Isaly Fasching, MD 03/22/16 1524

## 2016-03-24 DIAGNOSIS — Z23 Encounter for immunization: Secondary | ICD-10-CM | POA: Diagnosis not present

## 2016-03-24 DIAGNOSIS — R42 Dizziness and giddiness: Secondary | ICD-10-CM | POA: Diagnosis not present

## 2016-04-06 DIAGNOSIS — R911 Solitary pulmonary nodule: Secondary | ICD-10-CM | POA: Diagnosis not present

## 2016-04-06 DIAGNOSIS — J42 Unspecified chronic bronchitis: Secondary | ICD-10-CM | POA: Diagnosis not present

## 2016-04-06 DIAGNOSIS — I5022 Chronic systolic (congestive) heart failure: Secondary | ICD-10-CM | POA: Diagnosis not present

## 2016-04-28 DIAGNOSIS — H6122 Impacted cerumen, left ear: Secondary | ICD-10-CM | POA: Diagnosis not present

## 2016-04-28 DIAGNOSIS — R42 Dizziness and giddiness: Secondary | ICD-10-CM | POA: Diagnosis not present

## 2016-05-23 DIAGNOSIS — Z79899 Other long term (current) drug therapy: Secondary | ICD-10-CM | POA: Diagnosis not present

## 2016-05-23 DIAGNOSIS — S46211A Strain of muscle, fascia and tendon of other parts of biceps, right arm, initial encounter: Secondary | ICD-10-CM | POA: Diagnosis not present

## 2016-06-21 DIAGNOSIS — I495 Sick sinus syndrome: Secondary | ICD-10-CM | POA: Diagnosis not present

## 2016-06-29 DIAGNOSIS — J449 Chronic obstructive pulmonary disease, unspecified: Secondary | ICD-10-CM | POA: Diagnosis not present

## 2016-06-29 DIAGNOSIS — J441 Chronic obstructive pulmonary disease with (acute) exacerbation: Secondary | ICD-10-CM | POA: Diagnosis not present

## 2016-06-29 DIAGNOSIS — L309 Dermatitis, unspecified: Secondary | ICD-10-CM | POA: Diagnosis not present

## 2016-06-29 DIAGNOSIS — J42 Unspecified chronic bronchitis: Secondary | ICD-10-CM | POA: Diagnosis not present

## 2016-08-08 DIAGNOSIS — J449 Chronic obstructive pulmonary disease, unspecified: Secondary | ICD-10-CM | POA: Diagnosis not present

## 2016-08-08 DIAGNOSIS — J441 Chronic obstructive pulmonary disease with (acute) exacerbation: Secondary | ICD-10-CM | POA: Diagnosis not present

## 2016-10-06 DIAGNOSIS — L03119 Cellulitis of unspecified part of limb: Secondary | ICD-10-CM | POA: Diagnosis not present

## 2016-10-06 DIAGNOSIS — Z9109 Other allergy status, other than to drugs and biological substances: Secondary | ICD-10-CM | POA: Diagnosis not present

## 2016-11-14 DIAGNOSIS — N899 Noninflammatory disorder of vagina, unspecified: Secondary | ICD-10-CM | POA: Diagnosis not present

## 2016-11-14 DIAGNOSIS — N761 Subacute and chronic vaginitis: Secondary | ICD-10-CM | POA: Diagnosis not present

## 2016-11-14 DIAGNOSIS — L298 Other pruritus: Secondary | ICD-10-CM | POA: Diagnosis not present

## 2016-11-24 DIAGNOSIS — L98491 Non-pressure chronic ulcer of skin of other sites limited to breakdown of skin: Secondary | ICD-10-CM | POA: Diagnosis not present

## 2016-11-28 DIAGNOSIS — N3281 Overactive bladder: Secondary | ICD-10-CM | POA: Diagnosis not present

## 2016-11-28 DIAGNOSIS — N904 Leukoplakia of vulva: Secondary | ICD-10-CM | POA: Diagnosis not present

## 2016-11-28 DIAGNOSIS — N907 Vulvar cyst: Secondary | ICD-10-CM | POA: Diagnosis not present

## 2016-12-14 DIAGNOSIS — H524 Presbyopia: Secondary | ICD-10-CM | POA: Diagnosis not present

## 2016-12-14 DIAGNOSIS — Z0100 Encounter for examination of eyes and vision without abnormal findings: Secondary | ICD-10-CM | POA: Diagnosis not present

## 2016-12-14 DIAGNOSIS — Z961 Presence of intraocular lens: Secondary | ICD-10-CM | POA: Diagnosis not present

## 2016-12-27 DIAGNOSIS — N904 Leukoplakia of vulva: Secondary | ICD-10-CM | POA: Diagnosis not present

## 2016-12-27 DIAGNOSIS — I495 Sick sinus syndrome: Secondary | ICD-10-CM | POA: Diagnosis not present

## 2016-12-27 DIAGNOSIS — N3281 Overactive bladder: Secondary | ICD-10-CM | POA: Diagnosis not present

## 2017-02-09 DIAGNOSIS — R58 Hemorrhage, not elsewhere classified: Secondary | ICD-10-CM | POA: Diagnosis not present

## 2017-02-09 DIAGNOSIS — J40 Bronchitis, not specified as acute or chronic: Secondary | ICD-10-CM | POA: Diagnosis not present

## 2017-02-17 DIAGNOSIS — R58 Hemorrhage, not elsewhere classified: Secondary | ICD-10-CM | POA: Diagnosis not present

## 2017-03-29 DIAGNOSIS — Z23 Encounter for immunization: Secondary | ICD-10-CM | POA: Diagnosis not present

## 2017-03-29 DIAGNOSIS — N761 Subacute and chronic vaginitis: Secondary | ICD-10-CM | POA: Diagnosis not present

## 2017-05-01 DIAGNOSIS — Z0000 Encounter for general adult medical examination without abnormal findings: Secondary | ICD-10-CM | POA: Diagnosis not present

## 2017-05-01 DIAGNOSIS — I714 Abdominal aortic aneurysm, without rupture: Secondary | ICD-10-CM | POA: Diagnosis not present

## 2017-05-01 DIAGNOSIS — I700 Atherosclerosis of aorta: Secondary | ICD-10-CM | POA: Diagnosis not present

## 2017-05-01 DIAGNOSIS — R7302 Impaired glucose tolerance (oral): Secondary | ICD-10-CM | POA: Diagnosis not present

## 2017-05-01 DIAGNOSIS — I10 Essential (primary) hypertension: Secondary | ICD-10-CM | POA: Diagnosis not present

## 2017-05-01 DIAGNOSIS — J42 Unspecified chronic bronchitis: Secondary | ICD-10-CM | POA: Diagnosis not present

## 2017-05-01 DIAGNOSIS — I5022 Chronic systolic (congestive) heart failure: Secondary | ICD-10-CM | POA: Diagnosis not present

## 2017-06-20 DIAGNOSIS — R42 Dizziness and giddiness: Secondary | ICD-10-CM | POA: Diagnosis not present

## 2017-06-20 DIAGNOSIS — I480 Paroxysmal atrial fibrillation: Secondary | ICD-10-CM | POA: Diagnosis not present

## 2017-06-20 DIAGNOSIS — J42 Unspecified chronic bronchitis: Secondary | ICD-10-CM | POA: Diagnosis not present

## 2017-06-20 IMAGING — US US EXTREM LOW VENOUS*L*
1 series · 13 of 24 positions shown · non-contrast
Comparison: None.

CLINICAL DATA: Left leg pain and swelling.  Symptoms for 2 weeks.



[Series 1: us extrem low venous*left* · 0.09mm/px · 13 of 30 slices shown]
[im 1/30]
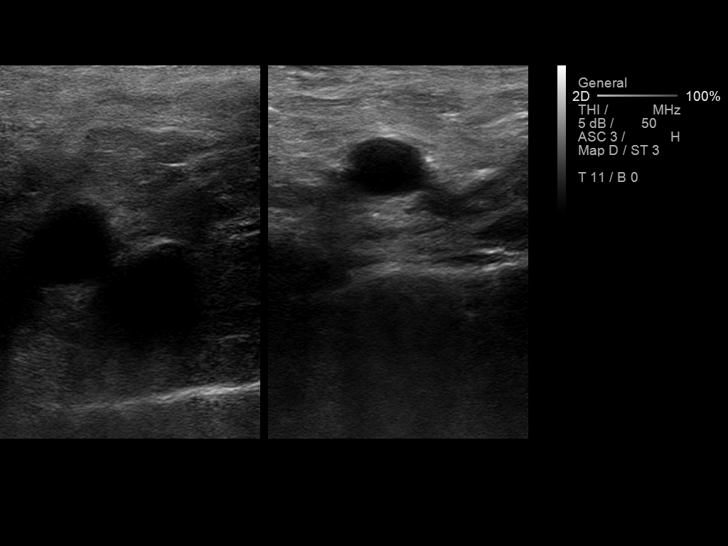
[im 3/30]
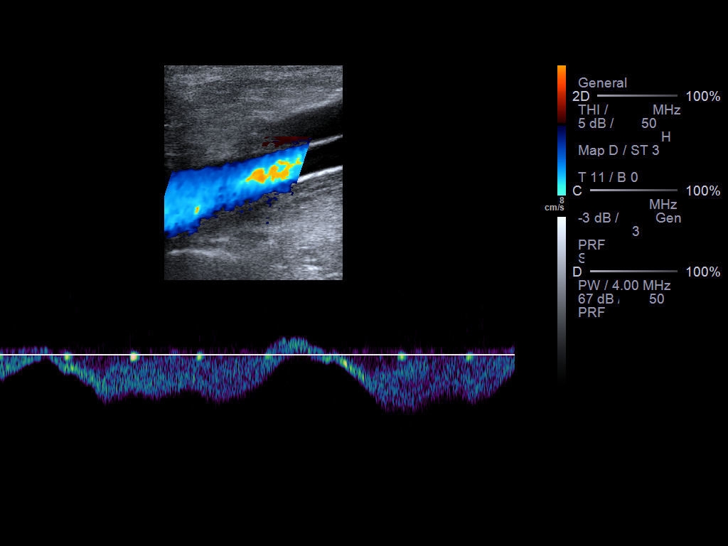
[im 6/30]
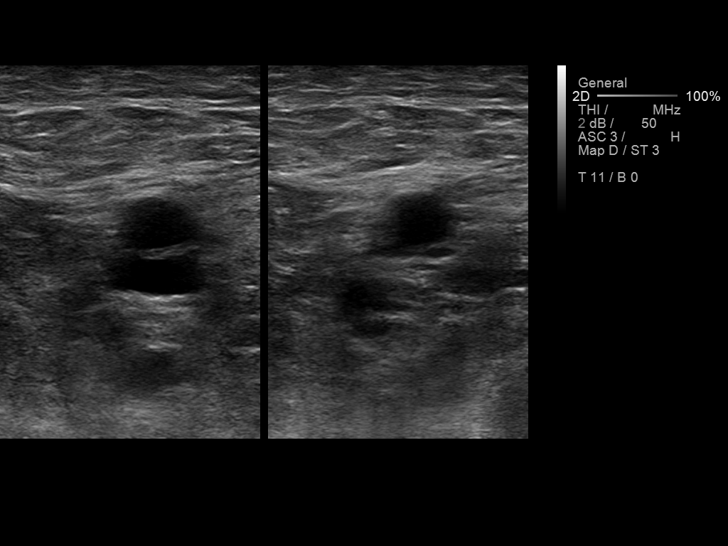
[im 8/30]
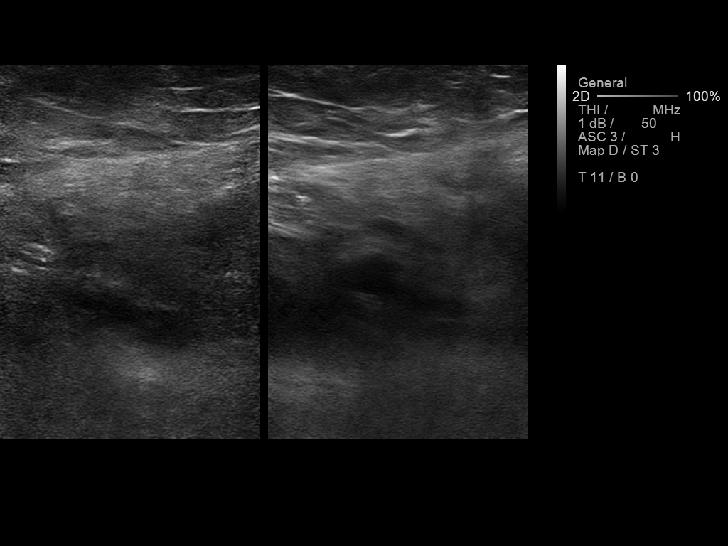
[im 11/30]
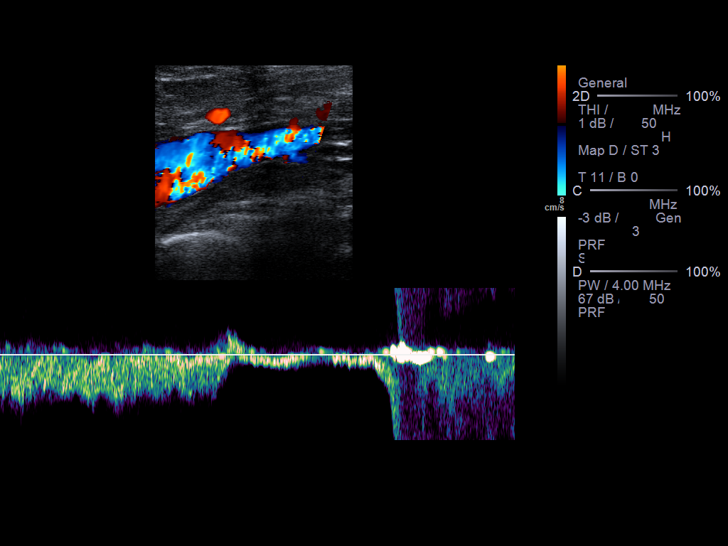
[im 13/30]
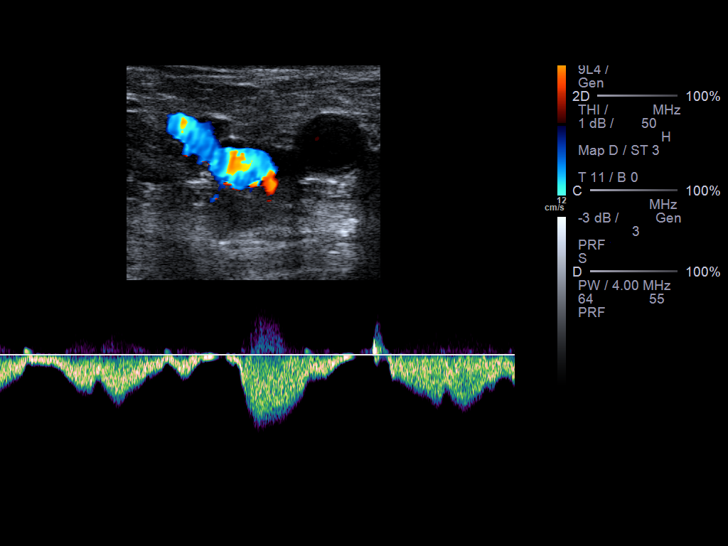
[im 16/30]
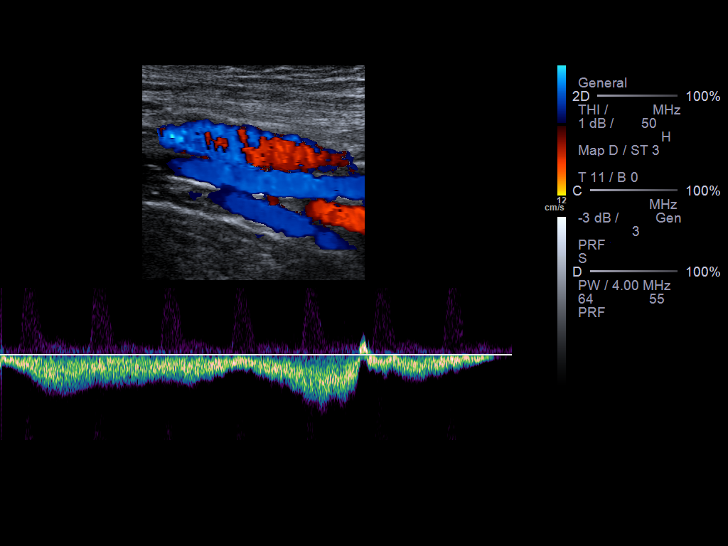
[im 17/30]
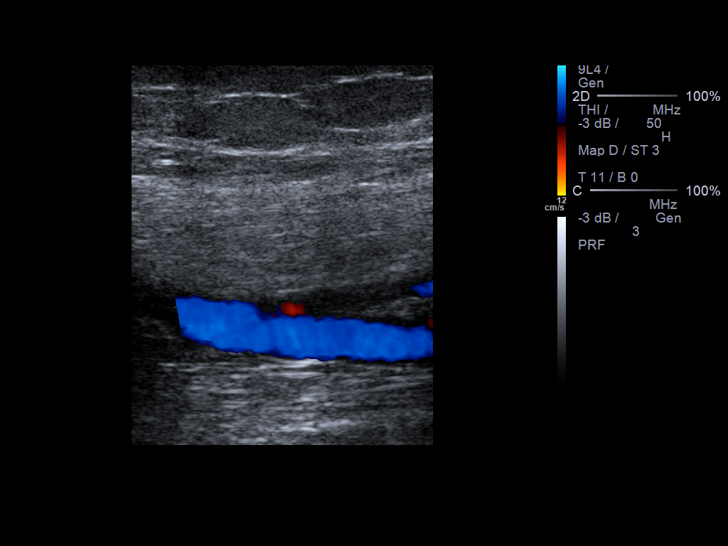
[im 19/30]
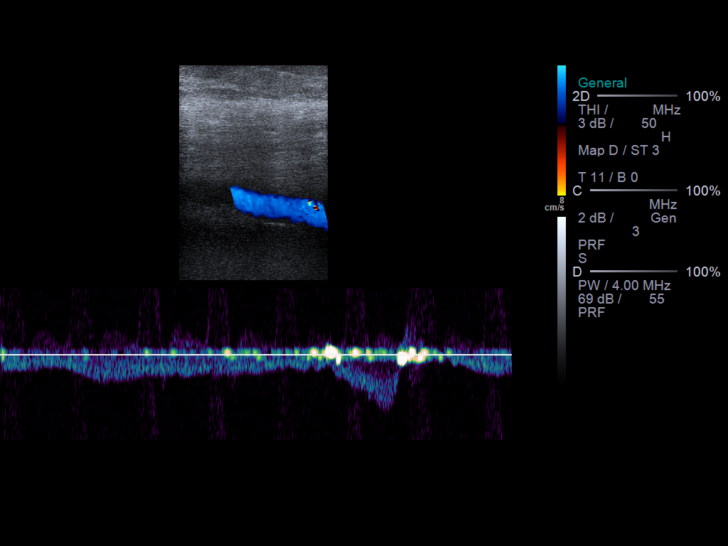
[im 22/30]
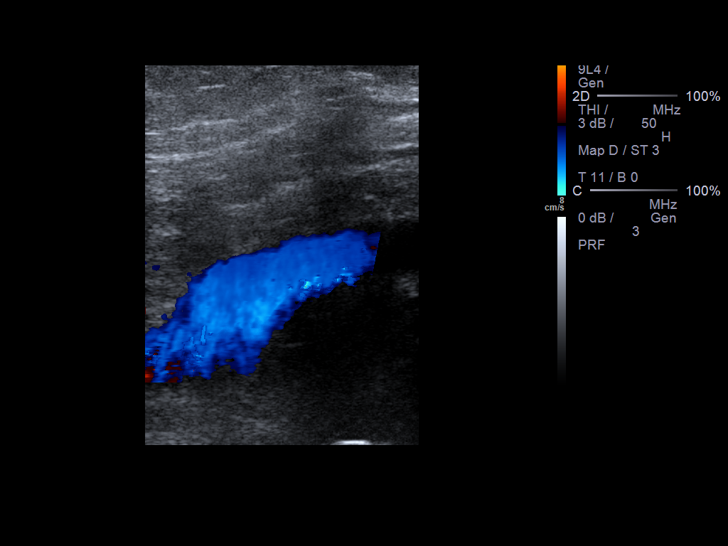
[im 24/30]
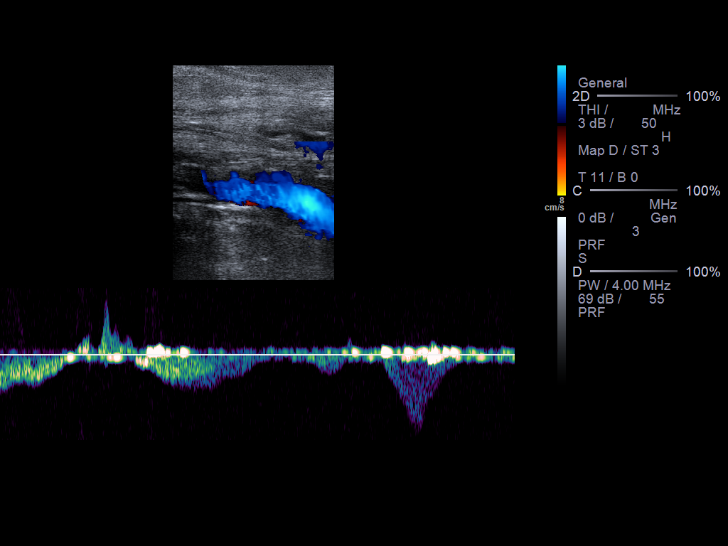
[im 27/30]
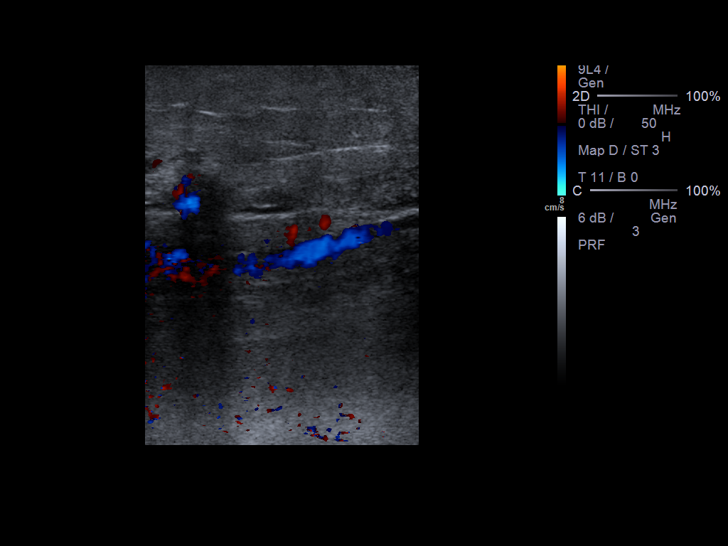
[im 30/30]
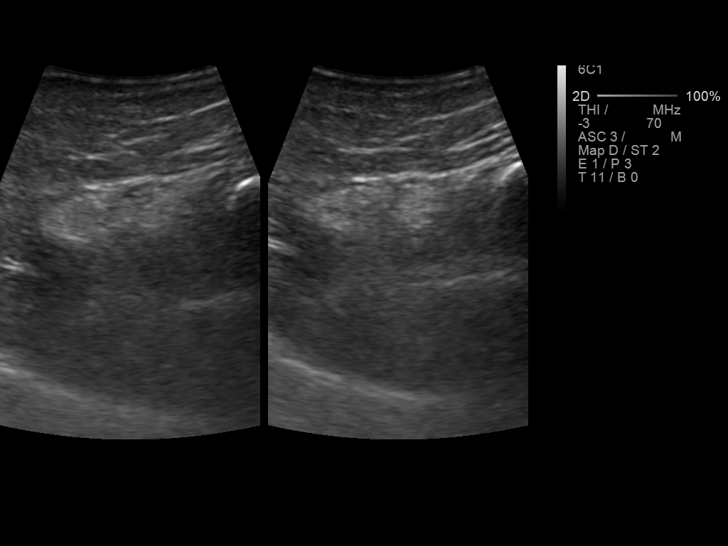

[13 of 24 positions shown; findings below may reference images not displayed]

FINDINGS: Contralateral Common Femoral Vein: Respiratory phasicity is normal
and symmetric with the symptomatic side. No evidence of thrombus.
Normal compressibility.

Common Femoral Vein: No evidence of thrombus. Normal
compressibility, respiratory phasicity and response to augmentation.

Saphenofemoral Junction: No evidence of thrombus. Normal
compressibility and flow on color Doppler imaging.

Profunda Femoral Vein: No evidence of thrombus. Normal
compressibility and flow on color Doppler imaging.

Femoral Vein: No evidence of thrombus. Normal compressibility,
respiratory phasicity and response to augmentation.

Popliteal Vein: No evidence of thrombus. Normal compressibility,
respiratory phasicity and response to augmentation.

Calf Veins: Peroneal vein is not visualized due to swelling.
Visualized calf veins demonstrate no evidence of thrombus. Normal
compressibility and flow on color Doppler imaging.

Superficial Great Saphenous Vein: No evidence of thrombus. Normal
compressibility and flow on color Doppler imaging.

Venous Reflux:  None.

Other Findings:  None.
IMPRESSION: No evidence of deep venous thrombosis.

## 2017-06-29 DIAGNOSIS — I700 Atherosclerosis of aorta: Secondary | ICD-10-CM | POA: Diagnosis not present

## 2017-06-29 DIAGNOSIS — I5022 Chronic systolic (congestive) heart failure: Secondary | ICD-10-CM | POA: Diagnosis not present

## 2017-06-29 DIAGNOSIS — I714 Abdominal aortic aneurysm, without rupture: Secondary | ICD-10-CM | POA: Diagnosis not present

## 2017-06-29 DIAGNOSIS — J42 Unspecified chronic bronchitis: Secondary | ICD-10-CM | POA: Diagnosis not present

## 2017-06-29 DIAGNOSIS — R7302 Impaired glucose tolerance (oral): Secondary | ICD-10-CM | POA: Diagnosis not present

## 2017-06-29 DIAGNOSIS — I10 Essential (primary) hypertension: Secondary | ICD-10-CM | POA: Diagnosis not present

## 2017-06-29 DIAGNOSIS — I480 Paroxysmal atrial fibrillation: Secondary | ICD-10-CM | POA: Diagnosis not present

## 2017-07-05 DIAGNOSIS — M25561 Pain in right knee: Secondary | ICD-10-CM | POA: Diagnosis not present

## 2017-07-05 DIAGNOSIS — G8929 Other chronic pain: Secondary | ICD-10-CM | POA: Diagnosis not present

## 2017-07-05 DIAGNOSIS — Z96651 Presence of right artificial knee joint: Secondary | ICD-10-CM | POA: Diagnosis not present

## 2017-07-05 DIAGNOSIS — S8011XA Contusion of right lower leg, initial encounter: Secondary | ICD-10-CM | POA: Diagnosis not present

## 2017-07-05 DIAGNOSIS — S8001XA Contusion of right knee, initial encounter: Secondary | ICD-10-CM | POA: Diagnosis not present

## 2017-07-31 DIAGNOSIS — I480 Paroxysmal atrial fibrillation: Secondary | ICD-10-CM | POA: Diagnosis not present

## 2017-07-31 DIAGNOSIS — J40 Bronchitis, not specified as acute or chronic: Secondary | ICD-10-CM | POA: Diagnosis not present

## 2017-07-31 DIAGNOSIS — I5022 Chronic systolic (congestive) heart failure: Secondary | ICD-10-CM | POA: Diagnosis not present

## 2017-08-17 DIAGNOSIS — N760 Acute vaginitis: Secondary | ICD-10-CM | POA: Diagnosis not present

## 2017-08-17 DIAGNOSIS — J329 Chronic sinusitis, unspecified: Secondary | ICD-10-CM | POA: Diagnosis not present

## 2017-08-17 DIAGNOSIS — F5101 Primary insomnia: Secondary | ICD-10-CM | POA: Diagnosis not present

## 2017-09-06 DIAGNOSIS — R21 Rash and other nonspecific skin eruption: Secondary | ICD-10-CM | POA: Diagnosis not present

## 2017-09-18 DIAGNOSIS — J069 Acute upper respiratory infection, unspecified: Secondary | ICD-10-CM | POA: Diagnosis not present

## 2017-09-18 DIAGNOSIS — J42 Unspecified chronic bronchitis: Secondary | ICD-10-CM | POA: Diagnosis not present

## 2017-10-07 ENCOUNTER — Other Ambulatory Visit: Payer: Self-pay

## 2017-10-07 ENCOUNTER — Emergency Department: Payer: Medicare HMO

## 2017-10-07 ENCOUNTER — Emergency Department
Admission: EM | Admit: 2017-10-07 | Discharge: 2017-10-07 | Disposition: A | Discharge status: Home / Self Care | Payer: Medicare HMO | Attending: Emergency Medicine | Admitting: Emergency Medicine

## 2017-10-07 ENCOUNTER — Encounter: Payer: Self-pay | Admitting: Emergency Medicine

## 2017-10-07 DIAGNOSIS — I509 Heart failure, unspecified: Secondary | ICD-10-CM | POA: Diagnosis not present

## 2017-10-07 DIAGNOSIS — Z96652 Presence of left artificial knee joint: Secondary | ICD-10-CM | POA: Diagnosis not present

## 2017-10-07 DIAGNOSIS — W2203XA Walked into furniture, initial encounter: Secondary | ICD-10-CM | POA: Diagnosis not present

## 2017-10-07 DIAGNOSIS — S0003XA Contusion of scalp, initial encounter: Secondary | ICD-10-CM | POA: Diagnosis not present

## 2017-10-07 DIAGNOSIS — Y939 Activity, unspecified: Secondary | ICD-10-CM | POA: Diagnosis not present

## 2017-10-07 DIAGNOSIS — Z950 Presence of cardiac pacemaker: Secondary | ICD-10-CM | POA: Insufficient documentation

## 2017-10-07 DIAGNOSIS — Y92009 Unspecified place in unspecified non-institutional (private) residence as the place of occurrence of the external cause: Secondary | ICD-10-CM | POA: Diagnosis not present

## 2017-10-07 DIAGNOSIS — Z87891 Personal history of nicotine dependence: Secondary | ICD-10-CM | POA: Diagnosis not present

## 2017-10-07 DIAGNOSIS — I110 Hypertensive heart disease with heart failure: Secondary | ICD-10-CM | POA: Diagnosis not present

## 2017-10-07 DIAGNOSIS — J449 Chronic obstructive pulmonary disease, unspecified: Secondary | ICD-10-CM | POA: Insufficient documentation

## 2017-10-07 DIAGNOSIS — S0990XA Unspecified injury of head, initial encounter: Secondary | ICD-10-CM

## 2017-10-07 DIAGNOSIS — R0981 Nasal congestion: Secondary | ICD-10-CM | POA: Diagnosis not present

## 2017-10-07 DIAGNOSIS — Z7901 Long term (current) use of anticoagulants: Secondary | ICD-10-CM | POA: Diagnosis not present

## 2017-10-07 DIAGNOSIS — Y999 Unspecified external cause status: Secondary | ICD-10-CM | POA: Insufficient documentation

## 2017-10-07 NOTE — ED Provider Notes (Signed)
[facility] Emergency Department Provider Note       Time seen: ----------------------------------------- 2:24 PM on 10/07/2017 -----------------------------------------   I have reviewed the triage vital signs and the nursing notes.  HISTORY   Chief Complaint Headache    HPI Natalie Schneider is a 84 y.o. female with a history of arthritis, asthma, atrial for ablation, CHF, COPD, hypertension and vertigo who presents to the ED for head injury.  Patient reports falling and hitting the left side of her head on a cabinet.  She was noted to have a small hematoma.  She was concerned there may be some bleeding as she takes Eliquis.  She denies persistent headache, dizziness, amnesia or other complaints.  Past Medical History:  Diagnosis Date  . Arthritis   . Asthma   . Atrial fibrillation (HCC)   . CHF (congestive heart failure) (HCC)   . COPD (chronic obstructive pulmonary disease) (HCC)   . Cough   . Environmental allergies   . GERD (gastroesophageal reflux disease)   . Hypertension   . Vertigo     Patient Active Problem List   Diagnosis Date Noted  . Near syncope 04/16/2015  . Heart block 04/02/2015  . Complete heart block (HCC) 04/01/2015  . S/P total knee replacement 11/05/2014    Past Surgical History:  Procedure Laterality Date  . BREAST BIOPSY Left   . cataracts Bilateral   . LAPAROSCOPIC OOPHERECTOMY Bilateral   . PACEMAKER INSERTION Left 04/03/2015   Procedure: INSERTION PACEMAKER;  Surgeon: Sharion Settler, MD;  Location: [facility] ORS;  Service: Cardiovascular;  Laterality: Left;  . THYROID SURGERY    . TOTAL KNEE ARTHROPLASTY Left 11/05/2014   Procedure: TOTAL KNEE ARTHROPLASTY;  Surgeon: Erin Sons, MD;  Location: [facility] ORS;  Service: Orthopedics;  Laterality: Left;    Allergies Amoxicillin; Augmentin [amoxicillin-pot clavulanate]; Ciprofloxacin; Clindamycin/lincomycin; Dicyclomine; Doxycycline; Flagyl [metronidazole]; Levaquin  [levofloxacin in d5w]; and Septra [sulfamethoxazole-trimethoprim]  Social History Social History   Tobacco Use  . Smoking status: Former Smoker    Last attempt to quit: 10/23/1974    Years since quitting: 42.9  . Smokeless tobacco: Never Used  Substance Use Topics  . Alcohol use: No  . Drug use: No   Review of Systems Constitutional: Negative for fever. ENT: Positive for recent nasal congestion Cardiovascular: Negative for chest pain. Respiratory: Negative for shortness of breath. Gastrointestinal: Negative for abdominal pain, vomiting and diarrhea. Musculoskeletal: Negative for back pain. Skin: Positive for left temporal scalp hematoma Neurological: Positive for mild left-sided headache  All systems negative/normal/unremarkable except as stated in the HPI  ____________________________________________   PHYSICAL EXAM:  VITAL SIGNS: ED Triage Vitals  Enc Vitals Group     BP 10/07/17 1347 (!) 169/90     Pulse Rate 10/07/17 1347 75     Resp 10/07/17 1347 16     Temp 10/07/17 1347 98.6 F (37 C)     Temp Source 10/07/17 1347 Oral     SpO2 10/07/17 1347 98 %     Weight 10/07/17 1348 140 lb (63.5 kg)     Height 10/07/17 1348  (1.499 m)     Head Circumference --      Peak Flow --      Pain Score 10/07/17 1348 0     Pain Loc --      Pain Edu? --      Excl. in GC? --    Constitutional: Alert and oriented. Well appearing and in no distress. Eyes: Conjunctivae  are normal. Normal extraocular movements. ENT   Head: Normocephalic, left temporal scalp hematoma.  No laceration   Nose: No congestion/rhinnorhea.   Mouth/Throat: Mucous membranes are moist.   Neck: No stridor. Cardiovascular: Normal rate, regular rhythm. No murmurs, rubs, or gallops. Respiratory: Normal respiratory effort without tachypnea nor retractions. Breath sounds are clear and equal bilaterally. No wheezes/rales/rhonchi. Gastrointestinal: Soft and nontender. Normal bowel  sounds Musculoskeletal: Nontender with normal range of motion in extremities. No lower extremity tenderness nor edema. Neurologic:  Normal speech and language. No gross focal neurologic deficits are appreciated.  Skin:  Skin is warm, dry and intact. No rash noted. Psychiatric: Mood and affect are normal. Speech and behavior are normal.  ____________________________________________  ED COURSE:  As part of my medical decision making, I reviewed the following data within the electronic MEDICAL RECORD NUMBER History obtained from family if available, nursing notes, old chart and ekg, as well as notes from prior ED visits. Patient presented for a minor head injury, we will assess with imaging as indicated at this time.   Procedures ____________________________________________   RADIOLOGY  IMPRESSION: Frontal atrophy, stable. Patchy supratentorial small vessel disease. No acute infarct. No mass or hemorrhage.  Small left frontal scalp hematoma. No fracture. There are foci arterial vascular calcification.  There are areas of paranasal sinus disease. There is mild left mastoid air cell disease.   ____________________________________________  DIFFERENTIAL DIAGNOSIS   Minor head injury, concussion, subdural, skull fracture  FINAL ASSESSMENT AND PLAN  Minor head injury   Plan: The patient had presented for a minor head injury.  Patient CT imaging just revealed some sinus disease for which she is currently taking antibiotics and decongestants.  No acute traumatic process.  She is cleared for outpatient follow-up.   Ulice Dash, MD   Note: This note was generated in part or whole with voice recognition software. Voice recognition is usually quite accurate but there are transcription errors that can and very often do occur. I apologize for any typographical errors that were not detected and corrected.     Emily Filbert, MD 10/07/17 [phone]

## 2017-10-07 NOTE — ED Notes (Signed)
Pt presents today via POV with family. Pt presents with complaints of a HA after hitting head on cabinet. Pt and family deny fall at this time. Awaiting CT Scan.

## 2017-10-07 NOTE — ED Triage Notes (Signed)
Pt to ED from home c/o hitting left head on cabinet, denies LOC or falling.  Patient has small hematoma to left head, no break in skin or bleeding.  Denies visual changes, denies real pain but states feels like a band around her head.  Takes eliquis.

## 2017-10-30 DIAGNOSIS — J42 Unspecified chronic bronchitis: Secondary | ICD-10-CM | POA: Diagnosis not present

## 2017-10-30 DIAGNOSIS — J209 Acute bronchitis, unspecified: Secondary | ICD-10-CM | POA: Diagnosis not present

## 2017-11-12 DIAGNOSIS — R69 Illness, unspecified: Secondary | ICD-10-CM | POA: Diagnosis not present

## 2017-11-21 DIAGNOSIS — I495 Sick sinus syndrome: Secondary | ICD-10-CM | POA: Diagnosis not present

## 2017-12-13 DIAGNOSIS — R42 Dizziness and giddiness: Secondary | ICD-10-CM | POA: Diagnosis not present

## 2017-12-13 DIAGNOSIS — I10 Essential (primary) hypertension: Secondary | ICD-10-CM | POA: Diagnosis not present

## 2017-12-13 DIAGNOSIS — I5022 Chronic systolic (congestive) heart failure: Secondary | ICD-10-CM | POA: Diagnosis not present

## 2017-12-13 DIAGNOSIS — I480 Paroxysmal atrial fibrillation: Secondary | ICD-10-CM | POA: Diagnosis not present

## 2017-12-13 DIAGNOSIS — J3489 Other specified disorders of nose and nasal sinuses: Secondary | ICD-10-CM | POA: Diagnosis not present

## 2018-01-08 DIAGNOSIS — I10 Essential (primary) hypertension: Secondary | ICD-10-CM | POA: Diagnosis not present

## 2018-01-08 DIAGNOSIS — R7302 Impaired glucose tolerance (oral): Secondary | ICD-10-CM | POA: Diagnosis not present

## 2018-01-08 DIAGNOSIS — Z0000 Encounter for general adult medical examination without abnormal findings: Secondary | ICD-10-CM | POA: Diagnosis not present

## 2018-01-08 DIAGNOSIS — I5022 Chronic systolic (congestive) heart failure: Secondary | ICD-10-CM | POA: Diagnosis not present

## 2018-01-08 DIAGNOSIS — Z9989 Dependence on other enabling machines and devices: Secondary | ICD-10-CM | POA: Diagnosis not present

## 2018-01-08 DIAGNOSIS — I700 Atherosclerosis of aorta: Secondary | ICD-10-CM | POA: Diagnosis not present

## 2018-01-08 DIAGNOSIS — I442 Atrioventricular block, complete: Secondary | ICD-10-CM | POA: Diagnosis not present

## 2018-01-08 DIAGNOSIS — I714 Abdominal aortic aneurysm, without rupture: Secondary | ICD-10-CM | POA: Diagnosis not present

## 2018-01-08 DIAGNOSIS — J42 Unspecified chronic bronchitis: Secondary | ICD-10-CM | POA: Diagnosis not present

## 2018-02-08 DIAGNOSIS — J441 Chronic obstructive pulmonary disease with (acute) exacerbation: Secondary | ICD-10-CM | POA: Diagnosis not present

## 2018-02-08 DIAGNOSIS — B379 Candidiasis, unspecified: Secondary | ICD-10-CM | POA: Diagnosis not present

## 2018-02-22 DIAGNOSIS — R1032 Left lower quadrant pain: Secondary | ICD-10-CM | POA: Diagnosis not present

## 2018-02-22 DIAGNOSIS — K5900 Constipation, unspecified: Secondary | ICD-10-CM | POA: Diagnosis not present

## 2018-02-22 DIAGNOSIS — R1031 Right lower quadrant pain: Secondary | ICD-10-CM | POA: Diagnosis not present

## 2018-02-23 DIAGNOSIS — R1031 Right lower quadrant pain: Secondary | ICD-10-CM | POA: Diagnosis not present

## 2018-02-23 DIAGNOSIS — K5900 Constipation, unspecified: Secondary | ICD-10-CM | POA: Diagnosis not present

## 2018-02-23 DIAGNOSIS — R1032 Left lower quadrant pain: Secondary | ICD-10-CM | POA: Diagnosis not present

## 2018-03-21 DIAGNOSIS — R42 Dizziness and giddiness: Secondary | ICD-10-CM | POA: Diagnosis not present

## 2018-04-10 DIAGNOSIS — Z23 Encounter for immunization: Secondary | ICD-10-CM | POA: Diagnosis not present

## 2018-05-22 DIAGNOSIS — I495 Sick sinus syndrome: Secondary | ICD-10-CM | POA: Diagnosis not present

## 2018-06-09 ENCOUNTER — Emergency Department: Payer: Medicare HMO

## 2018-06-09 ENCOUNTER — Emergency Department
Admission: EM | Admit: 2018-06-09 | Discharge: 2018-06-09 | Disposition: A | Discharge status: Home / Self Care | Payer: Medicare HMO | Attending: Emergency Medicine | Admitting: Emergency Medicine

## 2018-06-09 ENCOUNTER — Encounter: Payer: Self-pay | Admitting: *Deleted

## 2018-06-09 ENCOUNTER — Other Ambulatory Visit: Payer: Self-pay

## 2018-06-09 DIAGNOSIS — I110 Hypertensive heart disease with heart failure: Secondary | ICD-10-CM | POA: Diagnosis not present

## 2018-06-09 DIAGNOSIS — Z87891 Personal history of nicotine dependence: Secondary | ICD-10-CM | POA: Insufficient documentation

## 2018-06-09 DIAGNOSIS — R0789 Other chest pain: Secondary | ICD-10-CM

## 2018-06-09 DIAGNOSIS — J449 Chronic obstructive pulmonary disease, unspecified: Secondary | ICD-10-CM | POA: Diagnosis not present

## 2018-06-09 DIAGNOSIS — I4891 Unspecified atrial fibrillation: Secondary | ICD-10-CM | POA: Diagnosis not present

## 2018-06-09 DIAGNOSIS — Z79899 Other long term (current) drug therapy: Secondary | ICD-10-CM | POA: Insufficient documentation

## 2018-06-09 DIAGNOSIS — I509 Heart failure, unspecified: Secondary | ICD-10-CM | POA: Diagnosis not present

## 2018-06-09 DIAGNOSIS — Z950 Presence of cardiac pacemaker: Secondary | ICD-10-CM | POA: Diagnosis not present

## 2018-06-09 DIAGNOSIS — R079 Chest pain, unspecified: Secondary | ICD-10-CM | POA: Diagnosis not present

## 2018-06-09 LAB — BASIC METABOLIC PANEL
Anion gap: 7 (ref 5–15)
BUN: 14 mg/dL (ref 8–23)
CALCIUM: 9.2 mg/dL (ref 8.9–10.3)
CO2: 28 mmol/L (ref 22–32)
CREATININE: 0.77 mg/dL (ref 0.44–1.00)
Chloride: 102 mmol/L (ref 98–111)
Glucose, Bld: 202 mg/dL — ABNORMAL HIGH (ref 70–99)
Potassium: 3.6 mmol/L (ref 3.5–5.1)
SODIUM: 137 mmol/L (ref 135–145)

## 2018-06-09 LAB — CBC
HEMATOCRIT: 38.2 % (ref 36.0–46.0)
Hemoglobin: 13.2 g/dL (ref 12.0–15.0)
MCH: 33.2 pg (ref 26.0–34.0)
MCHC: 34.6 g/dL (ref 30.0–36.0)
MCV: 96 fL (ref 80.0–100.0)
NRBC: 0 % (ref 0.0–0.2)
PLATELETS: 75 10*3/uL — AB (ref 150–400)
RBC: 3.98 MIL/uL (ref 3.87–5.11)
RDW: 12.1 % (ref 11.5–15.5)
WBC: 3 10*3/uL — AB (ref 4.0–10.5)

## 2018-06-09 LAB — TROPONIN I

## 2018-06-09 NOTE — ED Notes (Signed)
Pt ambulatory to treatment room with family member.

## 2018-06-09 NOTE — ED Provider Notes (Signed)
[facility] Emergency Department Provider Note   ____________________________________________    I have reviewed the triage vital signs and the nursing notes.   HISTORY  Chief Complaint Chest Pain     HPI Natalie Schneider is a 85 y.o. female who presents with complaints of chest pain.  Patient describes over the last 24 to 48 hours she has had quick stabbing sensation to the left lateral chest that lasts for only about 1 second and then resolves.  She reports this is happened 2-4 times over the last 2 days.  Currently she feels quite well.  No heaviness diaphoresis or shortness of breath.  No nausea or vomiting.  Does have a history of atrial fibrillation CHF COPD with a pacemaker.  Pacemaker recently checked by her cardiologist without any issues.  Past Medical History:  Diagnosis Date  . Arthritis   . Asthma   . Atrial fibrillation (HCC)   . CHF (congestive heart failure) (HCC)   . COPD (chronic obstructive pulmonary disease) (HCC)   . Cough   . Environmental allergies   . GERD (gastroesophageal reflux disease)   . Hypertension   . Vertigo     Patient Active Problem List   Diagnosis Date Noted  . Near syncope 04/16/2015  . Heart block 04/02/2015  . Complete heart block (HCC) 04/01/2015  . S/P total knee replacement 11/05/2014    Past Surgical History:  Procedure Laterality Date  . BREAST BIOPSY Left   . cataracts Bilateral   . LAPAROSCOPIC OOPHERECTOMY Bilateral   . PACEMAKER INSERTION Left 04/03/2015   Procedure: INSERTION PACEMAKER;  Surgeon: Sharion SettlerKevin L Thomas, MD;  Location: [facility] ORS;  Service: Cardiovascular;  Laterality: Left;  . THYROID SURGERY    . TOTAL KNEE ARTHROPLASTY Left 11/05/2014   Procedure: TOTAL KNEE ARTHROPLASTY;  Surgeon: Erin SonsHarold Kernodle, MD;  Location: [facility] ORS;  Service: Orthopedics;  Laterality: Left;    Prior to Admission medications   Medication Sig Start Date End Date Taking? Authorizing Provider    acetaminophen (TYLENOL) 325 MG tablet Take 2 tablets (650 mg total) by mouth every 4 (four) hours as needed for mild pain. 04/04/15   Auburn BilberryPatel, Shreyang, MD  ALPRAZolam Prudy Feeler(XANAX) 0.25 MG tablet Take 0.25 mg by mouth at bedtime as needed for sleep.     [provider]  apixaban (ELIQUIS) 5 MG TABS tablet Take 5 mg by mouth 2 (two) times daily.    [provider]  busPIRone (BUSPAR) 5 MG tablet Take 10 mg by mouth 2 (two) times daily.     [provider]  digoxin (LANOXIN) 0.25 MG tablet Take 0.125 mg by mouth daily.     [provider]  diphenhydrAMINE (BENADRYL) 25 MG tablet Take 25 mg by mouth at bedtime.     [provider]  fluticasone (FLONASE) 50 MCG/ACT nasal spray Place 2 sprays into both nostrils at bedtime as needed for rhinitis.     [provider]  furosemide (LASIX) 20 MG tablet Take 20 mg by mouth daily as needed for edema.    [provider]  loperamide (IMODIUM) 2 MG capsule Take 2 mg by mouth as needed for diarrhea or loose stools.     [provider]  losartan (COZAAR) 100 MG tablet Take 1 tablet (100 mg total) by mouth daily. 04/17/15   Alford HighlandWieting, Richard, MD  meclizine (ANTIVERT) 25 MG tablet Take 1 tablet (25 mg total) by mouth 3 (three) times daily as needed for dizziness. 03/22/16  Rockne MenghiniNorman, Anne-Caroline, MD  metoprolol succinate (TOPROL-XL) 100 MG 24 hr tablet Take 1 tablet (100 mg total) by mouth daily. Take with or immediately following a meal. 04/17/15   Alford HighlandWieting, Richard, MD  montelukast (SINGULAIR) 10 MG tablet Take 10 mg by mouth at bedtime.    [provider]  ondansetron (ZOFRAN ODT) 4 MG disintegrating tablet Take 1 tablet (4 mg total) by mouth every 8 (eight) hours as needed for nausea or vomiting. 03/22/16   Rockne MenghiniNorman, Anne-Caroline, MD  pantoprazole (PROTONIX) 40 MG tablet Take 40 mg by mouth 2 (two) times daily.    [provider]  raloxifene (EVISTA) 60 MG tablet Take 60 mg by mouth daily.     [provider]     Allergies Amoxicillin; Augmentin [amoxicillin-pot clavulanate]; Ciprofloxacin; Clindamycin/lincomycin; Dicyclomine; Doxycycline; Flagyl [metronidazole]; Levaquin [levofloxacin in d5w]; and Septra [sulfamethoxazole-trimethoprim]  Family History  Problem Relation Age of Onset  . Heart disease Mother   . Heart disease Father   . Cancer Sister   . Cancer Brother     Social History Social History   Tobacco Use  . Smoking status: Former Smoker    Last attempt to quit: 10/23/1974    Years since quitting: 43.6  . Smokeless tobacco: Never Used  Substance Use Topics  . Alcohol use: No  . Drug use: No    Review of Systems  Constitutional: No fever/chills Eyes: No visual changes.  ENT: No sore throat. Cardiovascular: As above Respiratory: Denies shortness of breath. Gastrointestinal: No abdominal pain.  No nausea, no vomiting.   Genitourinary: Negative for dysuria. Musculoskeletal: Negative for back pain. Skin: Negative for rash. Neurological: Negative for headaches or weakness   ____________________________________________   PHYSICAL EXAM:  VITAL SIGNS: ED Triage Vitals  Enc Vitals Group     BP 06/09/18 1122 (!) 140/56     Pulse Rate 06/09/18 1122 77     Resp 06/09/18 1122 18     Temp 06/09/18 1122 98.6 F (37 C)     Temp Source 06/09/18 1122 Oral     SpO2 06/09/18 1122 94 %     Weight 06/09/18 1127 65.3 kg (144 lb)     Height 06/09/18 1127 1.499 m (4\' 11" )     Head Circumference --      Peak Flow --      Pain Score 06/09/18 1126 0     Pain Loc --      Pain Edu? --      Excl. in GC? --     Constitutional: Alert and oriented.  Eyes: Conjunctivae are normal.  Head: Atraumatic. Nose: No congestion/rhinnorhea. Mouth/Throat: Mucous membranes are moist.    Cardiovascular: Normal rate, regular rhythm. Grossly normal heart sounds.  Good peripheral circulation.  Mild chest wall tenderness just beneath pacemaker pocket, no erythema or  fluid collection or redness or rash Respiratory: Normal respiratory effort.  No retractions. Lungs CTAB. Gastrointestinal: Soft and nontender. No distention.    Musculoskeletal: No lower extremity tenderness nor edema.  Warm and well perfused Neurologic:  Normal speech and language. No gross focal neurologic deficits are appreciated.  Skin:  Skin is warm, dry and intact. No rash noted. Psychiatric: Mood and affect are normal. Speech and behavior are normal.  ____________________________________________   LABS (all labs ordered are listed, but only abnormal results are displayed)  Labs Reviewed  BASIC METABOLIC PANEL - Abnormal; Notable for the following components:      Result Value   Glucose, Bld 202 (*)  All other components within normal limits  CBC - Abnormal; Notable for the following components:   WBC 3.0 (*)    Platelets 75 (*)    All other components within normal limits  TROPONIN I   ____________________________________________  EKG  ED ECG REPORT I, Jene Every, the attending physician, personally viewed and interpreted this ECG.  Date: 06/09/2018  Rhythm: Atrial paced rhythm QRS Axis: normal Intervals: normal ST/T Wave abnormalities abnormal because of pacemaker   ____________________________________________  RADIOLOGY  Chest x-ray normal ____________________________________________   PROCEDURES  Procedure(s) performed: No  Procedures   Critical Care performed: No ____________________________________________   INITIAL IMPRESSION / ASSESSMENT AND PLAN / ED COURSE  Pertinent labs & imaging results that were available during my care of the patient were reviewed by me and considered in my medical decision making (see chart for details).  Patient well-appearing in no acute distress, she is asymptomatic in the emergency department.  EKG is unchanged from prior.  Lab work troponin normal.  She feels well and has no complaints.  Symptoms not  consistent with ACS, dissection, PE.  Given that she is asymptomatic with reassuring work-up appropriate for discharge at this time with close follow-up with her cardiologist Dr. Gwen Pounds, strict return precautions discussed    ____________________________________________   FINAL CLINICAL IMPRESSION(S) / ED DIAGNOSES  Final diagnoses:  Atypical chest pain        Note:  This document was prepared using Dragon voice recognition software and may include unintentional dictation errors.   Jene Every, MD 06/09/18 1451

## 2018-06-09 NOTE — ED Notes (Signed)
MD at bedside. 

## 2018-06-09 NOTE — ED Triage Notes (Signed)
Per patient's report, patient c/o lintermittent left side chest pain that began yesterday afternoon, did not wake the patient up during the night and resumed today. Patient denies increase in bilateral lower extremity swelling. Patient c/o   Coughing during the night.

## 2018-06-09 NOTE — ED Notes (Signed)
First Nurse Note: Pt to ED c/o chest pain x a few days. Pt is in NAD. Pt declines wheelchair at this time.

## 2018-06-19 DIAGNOSIS — J441 Chronic obstructive pulmonary disease with (acute) exacerbation: Secondary | ICD-10-CM | POA: Diagnosis not present

## 2018-06-19 DIAGNOSIS — I480 Paroxysmal atrial fibrillation: Secondary | ICD-10-CM | POA: Diagnosis not present

## 2018-06-19 DIAGNOSIS — I5022 Chronic systolic (congestive) heart failure: Secondary | ICD-10-CM | POA: Diagnosis not present

## 2018-06-22 DIAGNOSIS — I10 Essential (primary) hypertension: Secondary | ICD-10-CM | POA: Diagnosis not present

## 2018-06-22 DIAGNOSIS — I480 Paroxysmal atrial fibrillation: Secondary | ICD-10-CM | POA: Diagnosis not present

## 2018-06-22 DIAGNOSIS — I447 Left bundle-branch block, unspecified: Secondary | ICD-10-CM | POA: Diagnosis not present

## 2018-07-02 IMAGING — US US TRANSVAGINAL NON-OB
1 series · 13 of 25 positions shown · non-contrast
Comparison: CT abdomen and pelvis June 20, 2008

CLINICAL DATA: All vaginal bleeding

EXAM:
TRANSABDOMINAL AND TRANSVAGINAL ULTRASOUND OF PELVIS
TECHNIQUE: Study was performed transabdominally to optimize pelvic field of
view evaluation and transvaginally to optimize internal visceral
architecture evaluation.

[Series 1: us transvaginal non-ob · 0.18mm/px · 13 of 39 slices shown]
[im 1/39]
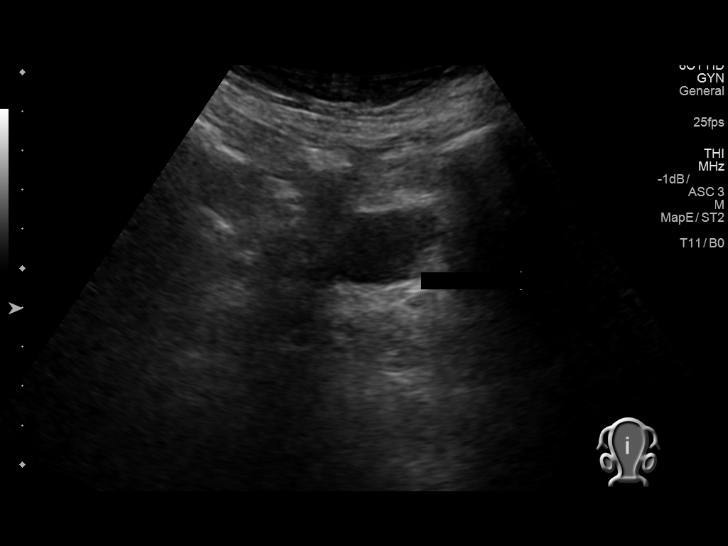
[im 4/39]
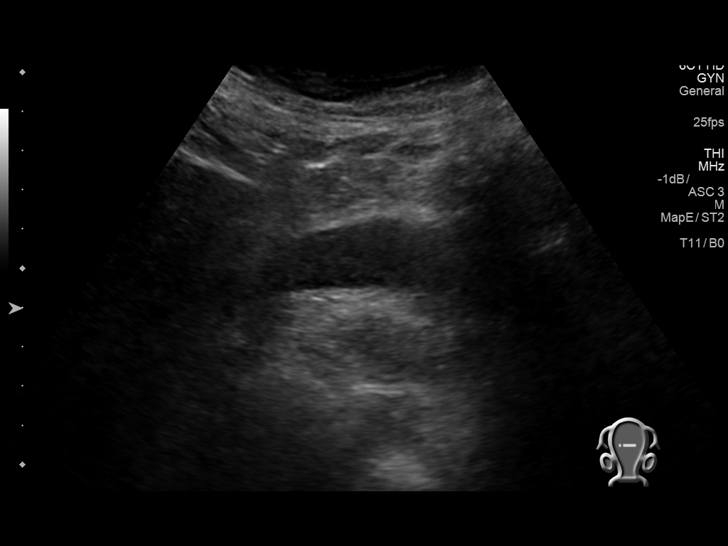
[im 7/39]
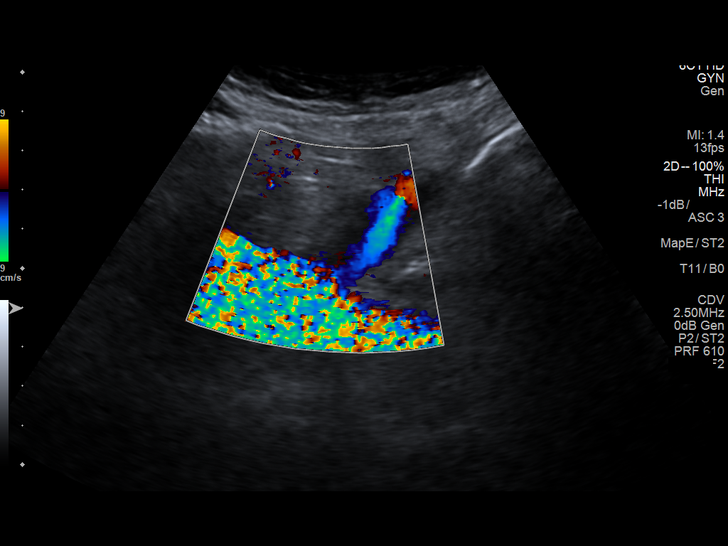
[im 10/39]
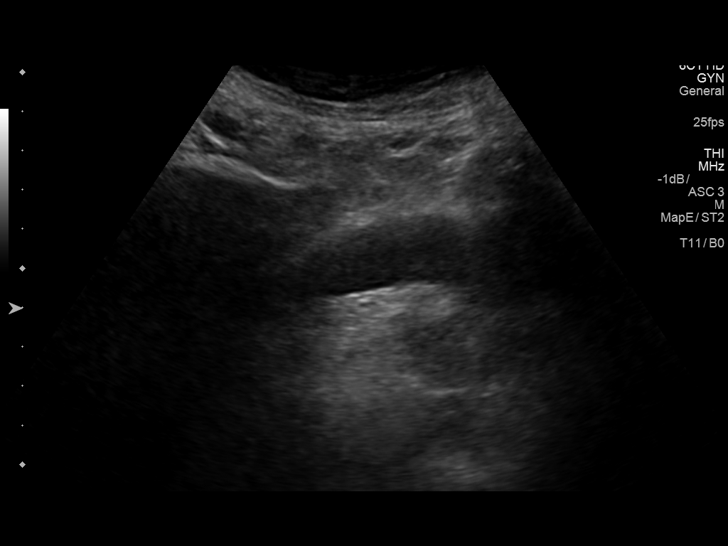
[im 13/39]
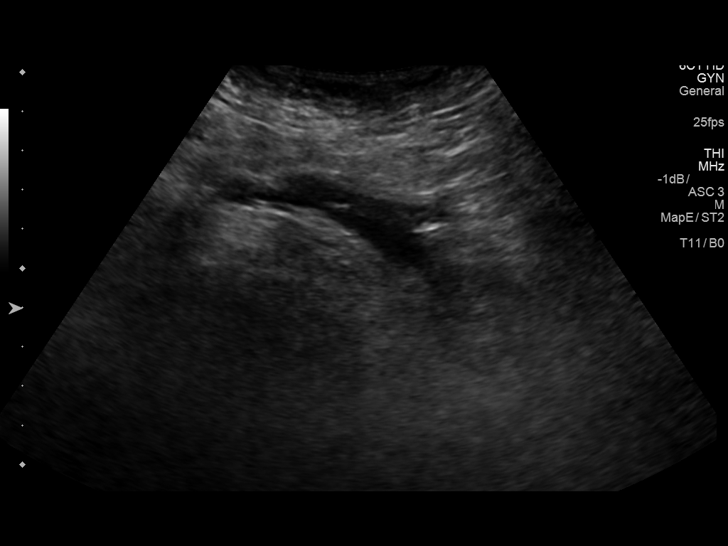
[im 16/39]
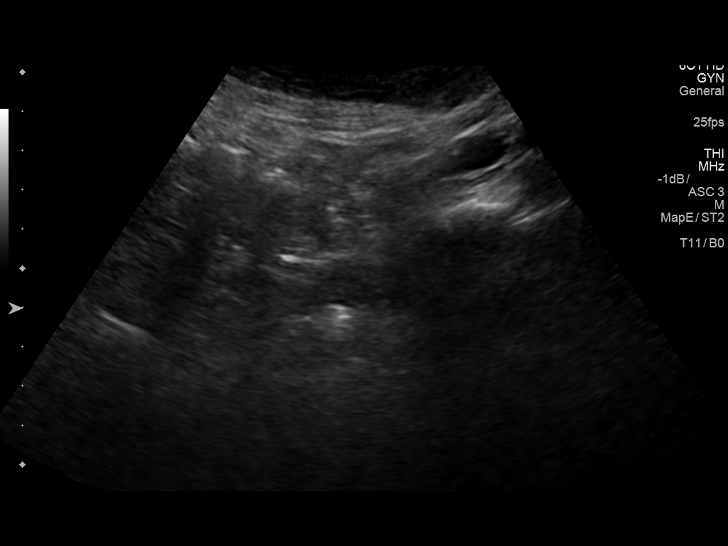
[im 20/39]
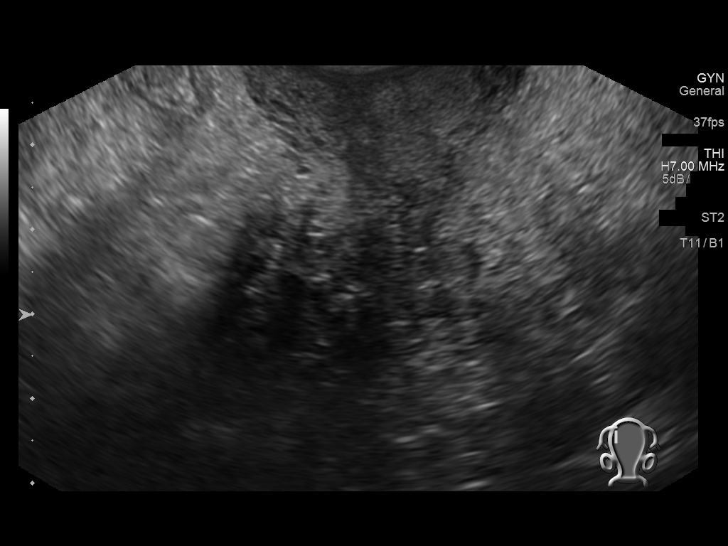
[im 23/39]
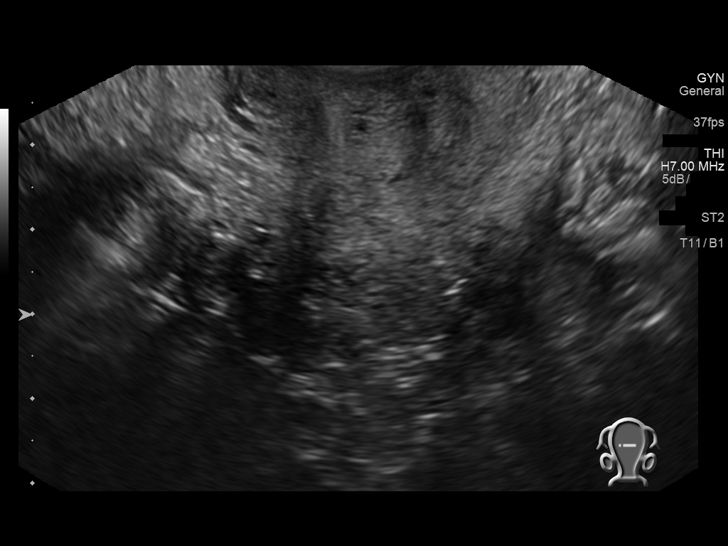
[im 26/39]
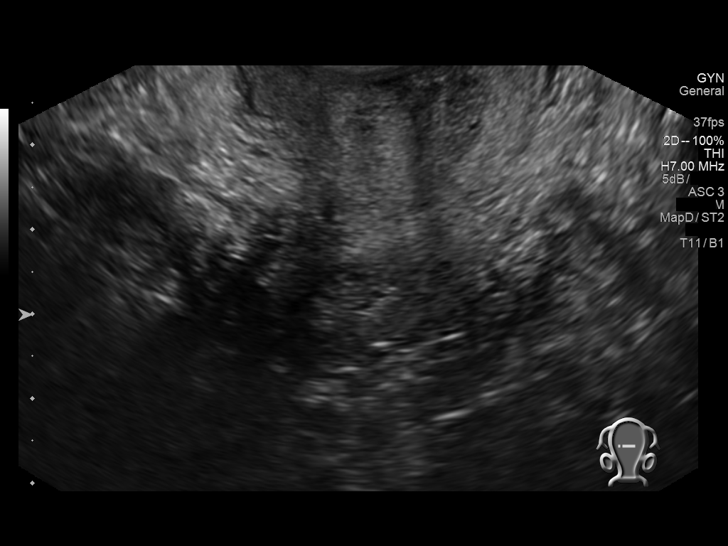
[im 29/39]
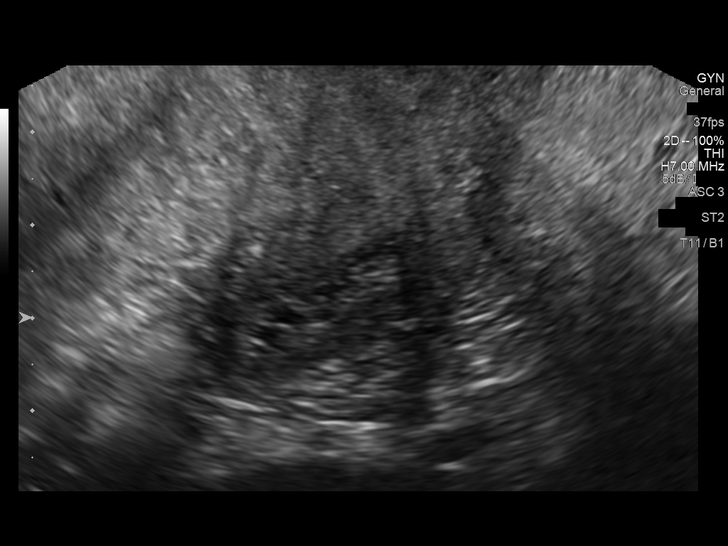
[im 32/39]
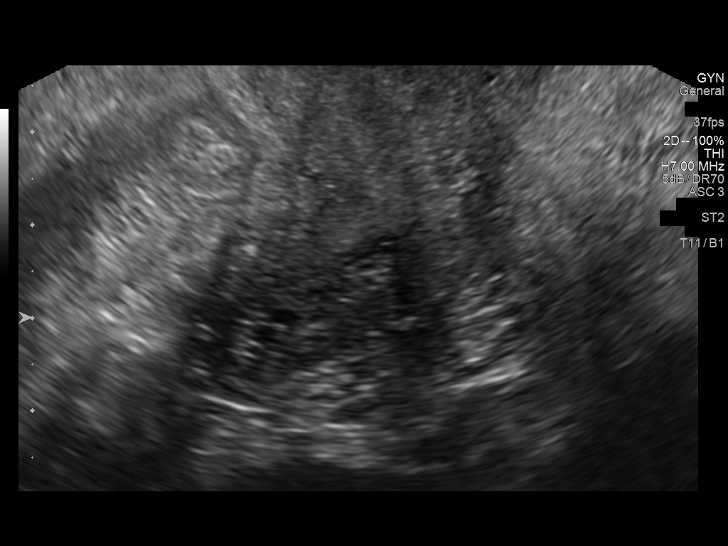
[im 35/39]
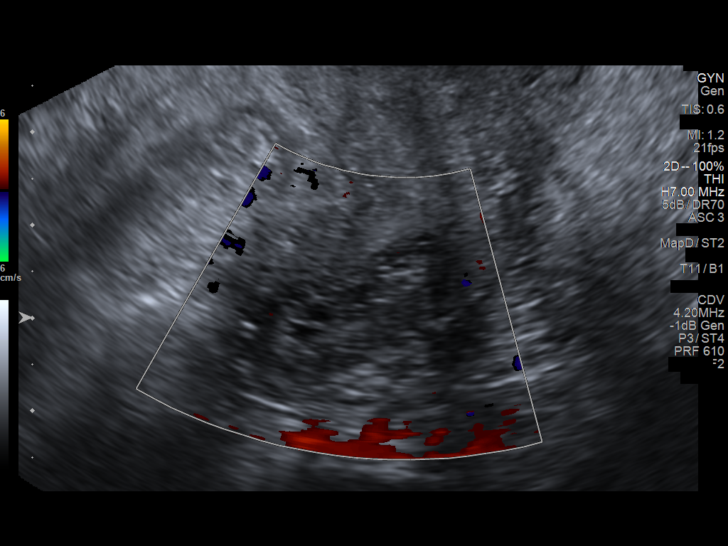
[im 39/39]
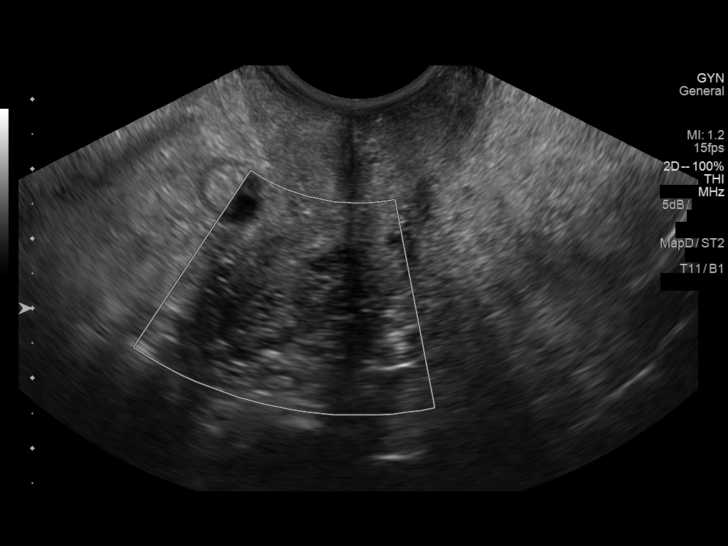

[13 of 25 positions shown; findings below may reference images not displayed]

FINDINGS: Uterus

Measurements: 4.5 x 2.5 x 2.7 cm. No fibroids or other mass
visualized.

Endometrium

Thickness: 7 mm. There are several small cystic areas within the
endometrium, largest measuring approximately 3 mm peer

Right ovary

Surgically absent. No right-sided pelvic lesions seen by ultrasound.

Left ovary

Surgically absent.  No left-sided pelvic lesions seen by ultrasound.

Other findings

No abnormal free fluid.
IMPRESSION: Endometrium is thickened for postmenopausal patient with cystic
change within the endometrium. Endometrial thickness is considered
abnormal for a symptomatic post-menopausal female. Endometrial
sampling should be considered to exclude carcinoma.

Uterus otherwise appears unremarkable. Both ovaries are surgically
absent. No extrauterine pelvic mass or inflammatory focus
identified. No free pelvic fluid.

These results will be called to the ordering clinician or
representative by the Radiologist Assistant, and communication
documented in the PACS or zVision Dashboard.

## 2018-07-11 DIAGNOSIS — I447 Left bundle-branch block, unspecified: Secondary | ICD-10-CM | POA: Diagnosis not present

## 2018-07-11 DIAGNOSIS — I480 Paroxysmal atrial fibrillation: Secondary | ICD-10-CM | POA: Diagnosis not present

## 2018-07-18 DIAGNOSIS — I10 Essential (primary) hypertension: Secondary | ICD-10-CM | POA: Diagnosis not present

## 2018-07-18 DIAGNOSIS — I480 Paroxysmal atrial fibrillation: Secondary | ICD-10-CM | POA: Diagnosis not present

## 2018-07-18 DIAGNOSIS — I442 Atrioventricular block, complete: Secondary | ICD-10-CM | POA: Diagnosis not present

## 2018-07-18 DIAGNOSIS — I119 Hypertensive heart disease without heart failure: Secondary | ICD-10-CM | POA: Diagnosis not present

## 2018-07-30 DIAGNOSIS — J441 Chronic obstructive pulmonary disease with (acute) exacerbation: Secondary | ICD-10-CM | POA: Diagnosis not present

## 2018-09-05 DIAGNOSIS — R300 Dysuria: Secondary | ICD-10-CM | POA: Diagnosis not present

## 2018-09-05 DIAGNOSIS — I480 Paroxysmal atrial fibrillation: Secondary | ICD-10-CM | POA: Diagnosis not present

## 2018-09-05 DIAGNOSIS — J441 Chronic obstructive pulmonary disease with (acute) exacerbation: Secondary | ICD-10-CM | POA: Diagnosis not present

## 2018-10-09 DIAGNOSIS — J441 Chronic obstructive pulmonary disease with (acute) exacerbation: Secondary | ICD-10-CM | POA: Diagnosis not present

## 2018-11-13 DIAGNOSIS — J441 Chronic obstructive pulmonary disease with (acute) exacerbation: Secondary | ICD-10-CM | POA: Diagnosis not present

## 2018-11-13 DIAGNOSIS — F5101 Primary insomnia: Secondary | ICD-10-CM | POA: Diagnosis not present

## 2018-11-13 DIAGNOSIS — R197 Diarrhea, unspecified: Secondary | ICD-10-CM | POA: Diagnosis not present

## 2018-11-20 DIAGNOSIS — I442 Atrioventricular block, complete: Secondary | ICD-10-CM | POA: Diagnosis not present

## 2018-12-12 DIAGNOSIS — R05 Cough: Secondary | ICD-10-CM | POA: Diagnosis not present

## 2019-01-15 DIAGNOSIS — I700 Atherosclerosis of aorta: Secondary | ICD-10-CM | POA: Diagnosis not present

## 2019-01-15 DIAGNOSIS — I447 Left bundle-branch block, unspecified: Secondary | ICD-10-CM | POA: Diagnosis not present

## 2019-01-15 DIAGNOSIS — I495 Sick sinus syndrome: Secondary | ICD-10-CM | POA: Diagnosis not present

## 2019-01-15 DIAGNOSIS — I480 Paroxysmal atrial fibrillation: Secondary | ICD-10-CM | POA: Diagnosis not present

## 2019-01-15 DIAGNOSIS — I119 Hypertensive heart disease without heart failure: Secondary | ICD-10-CM | POA: Diagnosis not present

## 2019-01-15 DIAGNOSIS — I10 Essential (primary) hypertension: Secondary | ICD-10-CM | POA: Diagnosis not present

## 2019-01-22 DIAGNOSIS — J441 Chronic obstructive pulmonary disease with (acute) exacerbation: Secondary | ICD-10-CM | POA: Diagnosis not present

## 2019-01-22 DIAGNOSIS — I5022 Chronic systolic (congestive) heart failure: Secondary | ICD-10-CM | POA: Diagnosis not present

## 2019-02-22 DIAGNOSIS — J441 Chronic obstructive pulmonary disease with (acute) exacerbation: Secondary | ICD-10-CM | POA: Diagnosis not present

## 2019-02-22 DIAGNOSIS — Z87891 Personal history of nicotine dependence: Secondary | ICD-10-CM | POA: Diagnosis not present

## 2019-02-22 DIAGNOSIS — I5022 Chronic systolic (congestive) heart failure: Secondary | ICD-10-CM | POA: Diagnosis not present

## 2019-03-22 DIAGNOSIS — I5022 Chronic systolic (congestive) heart failure: Secondary | ICD-10-CM | POA: Diagnosis not present

## 2019-03-22 DIAGNOSIS — J441 Chronic obstructive pulmonary disease with (acute) exacerbation: Secondary | ICD-10-CM | POA: Diagnosis not present

## 2019-03-22 DIAGNOSIS — Z950 Presence of cardiac pacemaker: Secondary | ICD-10-CM | POA: Diagnosis not present

## 2019-03-22 DIAGNOSIS — Z87891 Personal history of nicotine dependence: Secondary | ICD-10-CM | POA: Diagnosis not present

## 2019-03-22 DIAGNOSIS — K219 Gastro-esophageal reflux disease without esophagitis: Secondary | ICD-10-CM | POA: Diagnosis not present

## 2019-03-22 DIAGNOSIS — I517 Cardiomegaly: Secondary | ICD-10-CM | POA: Diagnosis not present

## 2019-03-22 DIAGNOSIS — R05 Cough: Secondary | ICD-10-CM | POA: Diagnosis not present

## 2019-03-27 ENCOUNTER — Other Ambulatory Visit: Payer: Self-pay | Admitting: Specialist

## 2019-03-27 DIAGNOSIS — J439 Emphysema, unspecified: Secondary | ICD-10-CM | POA: Diagnosis not present

## 2019-03-27 DIAGNOSIS — R053 Chronic cough: Secondary | ICD-10-CM

## 2019-03-27 DIAGNOSIS — R05 Cough: Secondary | ICD-10-CM | POA: Diagnosis not present

## 2019-03-27 DIAGNOSIS — R0602 Shortness of breath: Secondary | ICD-10-CM | POA: Diagnosis not present

## 2019-04-09 ENCOUNTER — Other Ambulatory Visit: Payer: Self-pay

## 2019-04-09 ENCOUNTER — Ambulatory Visit
Admission: RE | Admit: 2019-04-09 | Discharge: 2019-04-09 | Disposition: A | Discharge status: Home / Self Care | Payer: Medicare HMO | Source: Ambulatory Visit | Attending: Specialist | Admitting: Specialist

## 2019-04-09 DIAGNOSIS — J029 Acute pharyngitis, unspecified: Secondary | ICD-10-CM | POA: Diagnosis not present

## 2019-04-09 DIAGNOSIS — R053 Chronic cough: Secondary | ICD-10-CM

## 2019-04-09 DIAGNOSIS — Z87891 Personal history of nicotine dependence: Secondary | ICD-10-CM | POA: Diagnosis not present

## 2019-04-09 DIAGNOSIS — R05 Cough: Secondary | ICD-10-CM | POA: Insufficient documentation

## 2019-04-09 DIAGNOSIS — R918 Other nonspecific abnormal finding of lung field: Secondary | ICD-10-CM | POA: Diagnosis not present

## 2019-04-09 DIAGNOSIS — R0602 Shortness of breath: Secondary | ICD-10-CM | POA: Insufficient documentation

## 2019-04-09 DIAGNOSIS — K219 Gastro-esophageal reflux disease without esophagitis: Secondary | ICD-10-CM | POA: Diagnosis not present

## 2019-04-25 DIAGNOSIS — J310 Chronic rhinitis: Secondary | ICD-10-CM | POA: Diagnosis not present

## 2019-04-25 DIAGNOSIS — R05 Cough: Secondary | ICD-10-CM | POA: Diagnosis not present

## 2019-05-09 DIAGNOSIS — Z03818 Encounter for observation for suspected exposure to other biological agents ruled out: Secondary | ICD-10-CM | POA: Diagnosis not present

## 2019-05-10 IMAGING — CR DG CHEST 2V
1 series · 2 of 2 positions shown · non-contrast
Comparison: 04/15/2015.

CLINICAL DATA: Intermittent left chest pain since yesterday.
Ex-smoker.

EXAM:
CHEST - 2 VIEW

[Series 1: dg chest 2 view · 0.14mm/px · 2 of 2 slices shown]
[im 1/2]
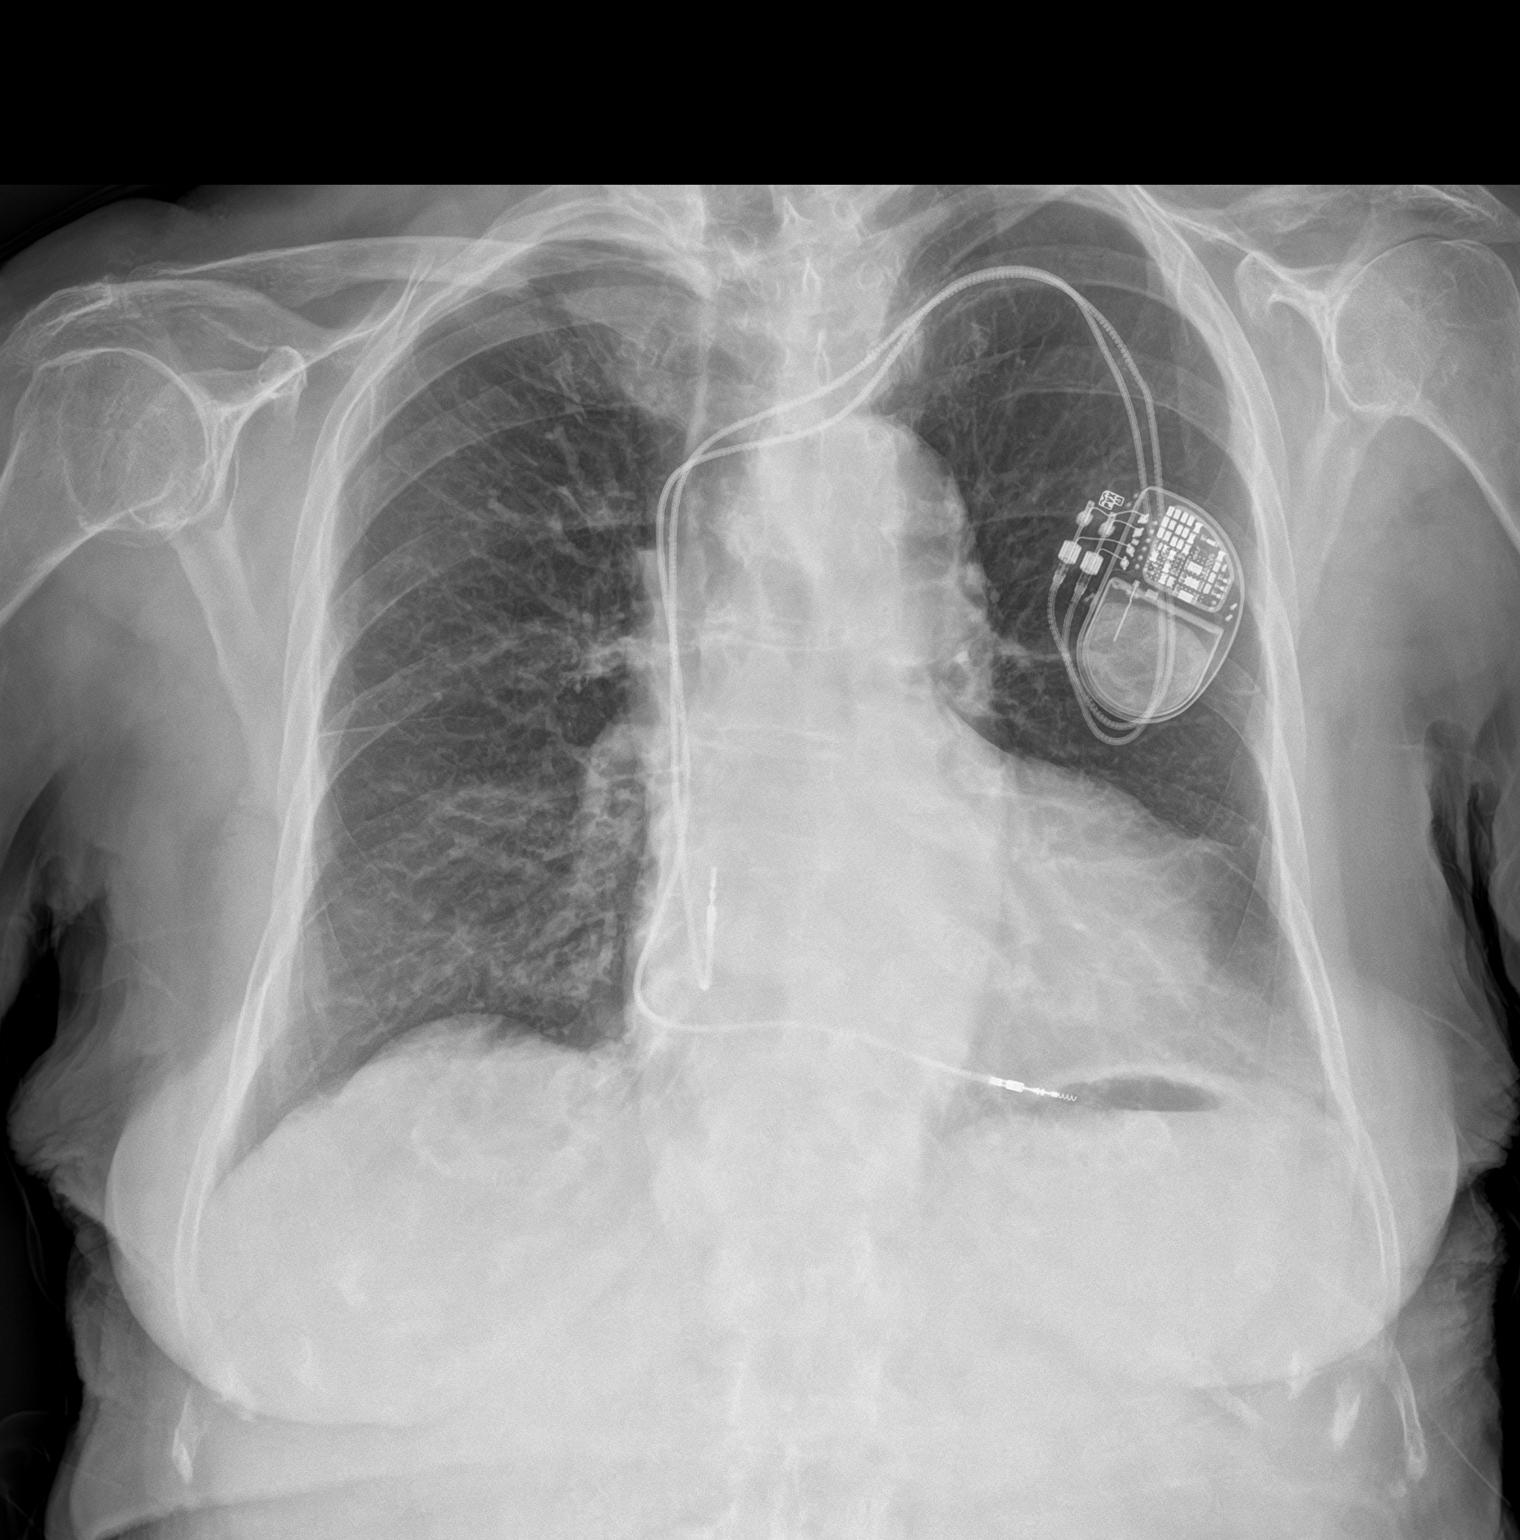
[im 2/2]
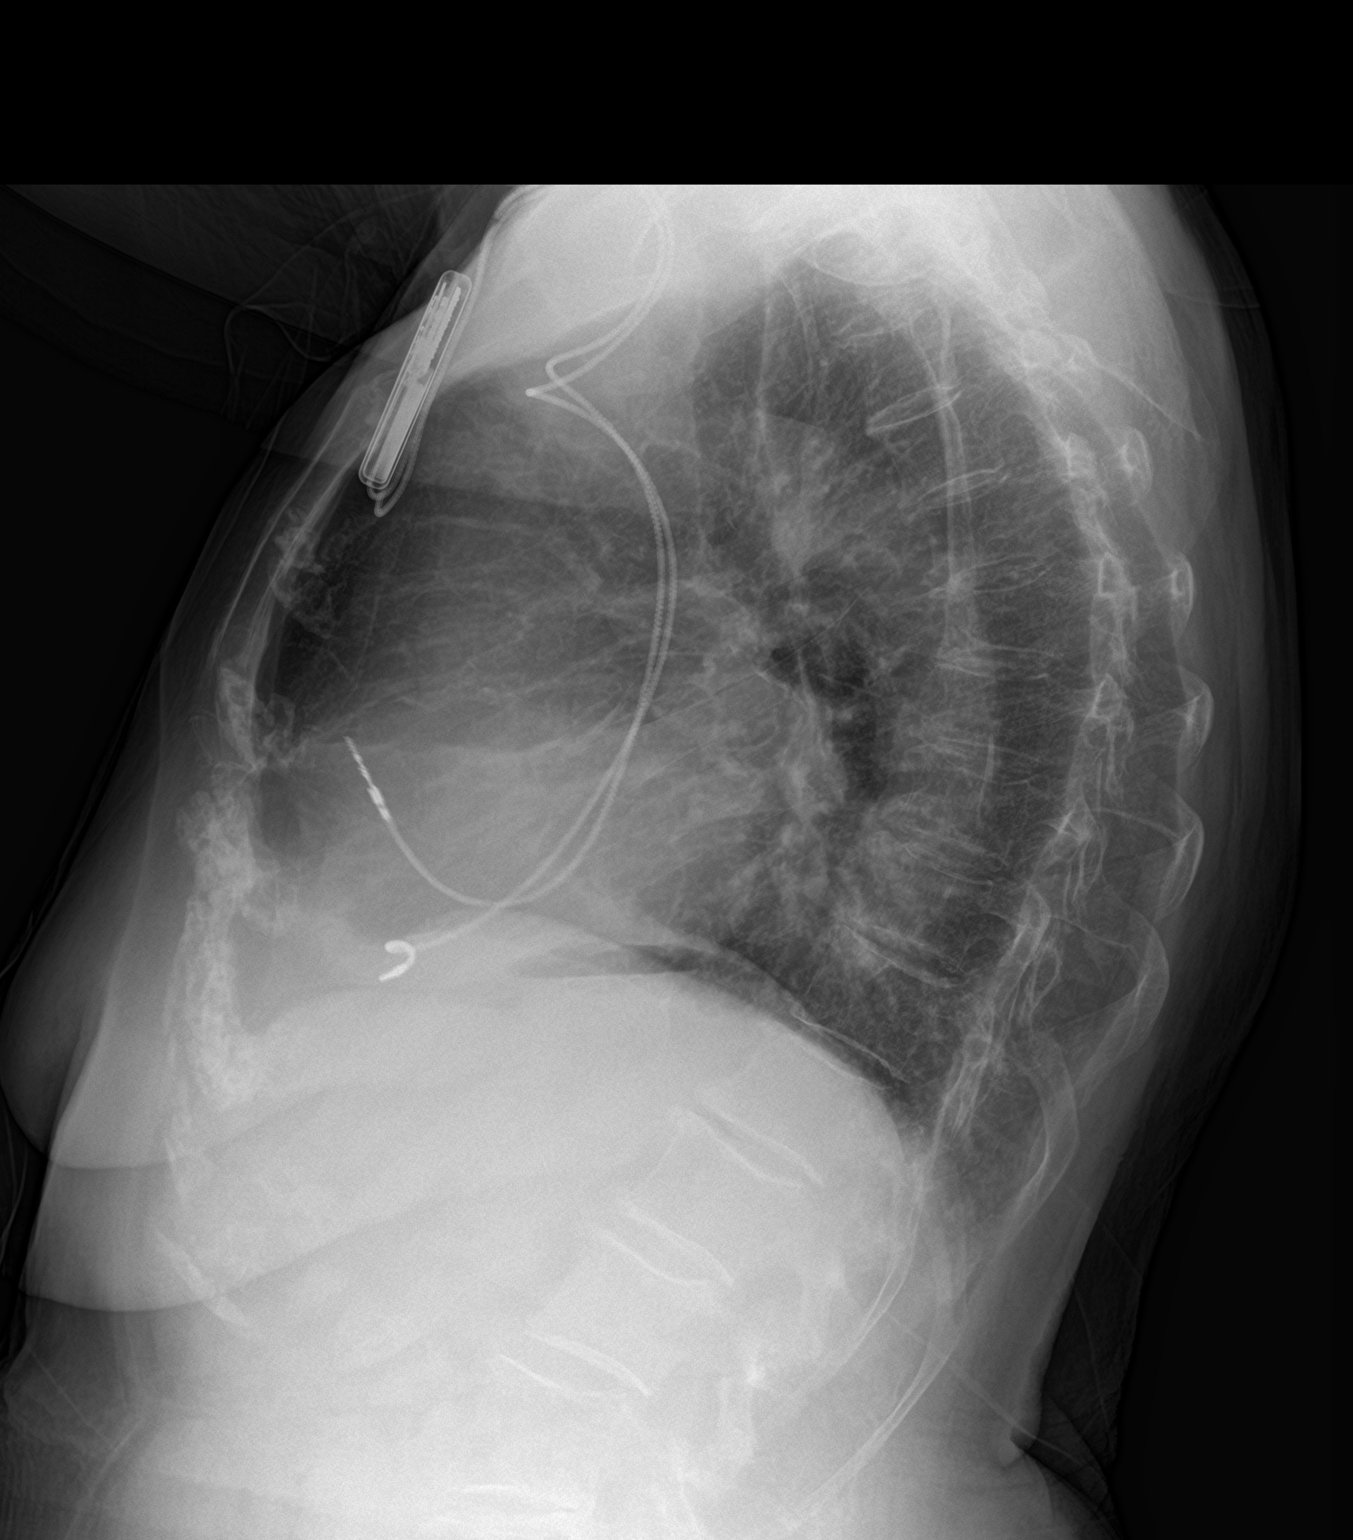

[2 of 2 positions shown; findings below may reference images not displayed]

FINDINGS: Stable mildly enlarged cardiac silhouette and tortuous aorta. Stable
left subclavian pacemaker leads. The lungs remain hyperexpanded with
stable mildly prominent interstitial markings and peribronchial
thickening. Stable small amount of linear scarring on the left.
Diffuse osteopenia with a stable mild midthoracic vertebral
compression deformity.
IMPRESSION: No acute finding. Stable cardiomegaly and mild changes of COPD and
chronic bronchitis.

## 2019-05-28 DIAGNOSIS — I442 Atrioventricular block, complete: Secondary | ICD-10-CM | POA: Diagnosis not present

## 2019-06-09 DIAGNOSIS — T50905A Adverse effect of unspecified drugs, medicaments and biological substances, initial encounter: Secondary | ICD-10-CM | POA: Diagnosis not present

## 2019-06-09 DIAGNOSIS — R05 Cough: Secondary | ICD-10-CM | POA: Diagnosis not present

## 2019-06-09 DIAGNOSIS — R12 Heartburn: Secondary | ICD-10-CM | POA: Diagnosis not present

## 2019-06-14 DIAGNOSIS — Z87891 Personal history of nicotine dependence: Secondary | ICD-10-CM | POA: Diagnosis not present

## 2019-06-14 DIAGNOSIS — K219 Gastro-esophageal reflux disease without esophagitis: Secondary | ICD-10-CM | POA: Diagnosis not present

## 2019-06-14 DIAGNOSIS — R05 Cough: Secondary | ICD-10-CM | POA: Diagnosis not present

## 2019-06-14 DIAGNOSIS — I10 Essential (primary) hypertension: Secondary | ICD-10-CM | POA: Diagnosis not present

## 2019-07-18 DIAGNOSIS — I480 Paroxysmal atrial fibrillation: Secondary | ICD-10-CM | POA: Diagnosis not present

## 2019-07-18 DIAGNOSIS — I714 Abdominal aortic aneurysm, without rupture: Secondary | ICD-10-CM | POA: Diagnosis not present

## 2019-07-18 DIAGNOSIS — R7302 Impaired glucose tolerance (oral): Secondary | ICD-10-CM | POA: Diagnosis not present

## 2019-07-18 DIAGNOSIS — J441 Chronic obstructive pulmonary disease with (acute) exacerbation: Secondary | ICD-10-CM | POA: Diagnosis not present

## 2019-07-18 DIAGNOSIS — Z09 Encounter for follow-up examination after completed treatment for conditions other than malignant neoplasm: Secondary | ICD-10-CM | POA: Diagnosis not present

## 2019-07-18 DIAGNOSIS — Z87891 Personal history of nicotine dependence: Secondary | ICD-10-CM | POA: Diagnosis not present

## 2019-07-24 DIAGNOSIS — H524 Presbyopia: Secondary | ICD-10-CM | POA: Diagnosis not present

## 2019-07-24 DIAGNOSIS — Z961 Presence of intraocular lens: Secondary | ICD-10-CM | POA: Diagnosis not present

## 2019-08-05 DIAGNOSIS — I509 Heart failure, unspecified: Secondary | ICD-10-CM | POA: Diagnosis not present

## 2019-08-05 DIAGNOSIS — I110 Hypertensive heart disease with heart failure: Secondary | ICD-10-CM | POA: Diagnosis not present

## 2019-08-05 DIAGNOSIS — I340 Nonrheumatic mitral (valve) insufficiency: Secondary | ICD-10-CM | POA: Diagnosis not present

## 2019-08-05 DIAGNOSIS — R600 Localized edema: Secondary | ICD-10-CM | POA: Diagnosis not present

## 2019-08-05 DIAGNOSIS — I447 Left bundle-branch block, unspecified: Secondary | ICD-10-CM | POA: Diagnosis not present

## 2019-08-05 DIAGNOSIS — I714 Abdominal aortic aneurysm, without rupture: Secondary | ICD-10-CM | POA: Diagnosis not present

## 2019-08-05 DIAGNOSIS — I10 Essential (primary) hypertension: Secondary | ICD-10-CM | POA: Diagnosis not present

## 2019-08-14 DIAGNOSIS — Z87891 Personal history of nicotine dependence: Secondary | ICD-10-CM | POA: Diagnosis not present

## 2019-08-14 DIAGNOSIS — J441 Chronic obstructive pulmonary disease with (acute) exacerbation: Secondary | ICD-10-CM | POA: Diagnosis not present

## 2019-09-23 ENCOUNTER — Other Ambulatory Visit: Payer: Self-pay | Admitting: Specialist

## 2019-09-23 DIAGNOSIS — R0600 Dyspnea, unspecified: Secondary | ICD-10-CM | POA: Diagnosis not present

## 2019-09-23 DIAGNOSIS — R600 Localized edema: Secondary | ICD-10-CM

## 2019-09-23 DIAGNOSIS — M7989 Other specified soft tissue disorders: Secondary | ICD-10-CM

## 2019-09-23 DIAGNOSIS — J449 Chronic obstructive pulmonary disease, unspecified: Secondary | ICD-10-CM | POA: Diagnosis not present

## 2019-09-23 DIAGNOSIS — R05 Cough: Secondary | ICD-10-CM | POA: Diagnosis not present

## 2019-09-24 ENCOUNTER — Other Ambulatory Visit: Payer: Self-pay

## 2019-09-24 ENCOUNTER — Ambulatory Visit
Admission: RE | Admit: 2019-09-24 | Discharge: 2019-09-24 | Disposition: A | Discharge status: Home / Self Care | Payer: Medicare HMO | Source: Ambulatory Visit | Attending: Specialist | Admitting: Specialist

## 2019-09-24 DIAGNOSIS — M7989 Other specified soft tissue disorders: Secondary | ICD-10-CM | POA: Diagnosis not present

## 2019-09-24 DIAGNOSIS — M79662 Pain in left lower leg: Secondary | ICD-10-CM | POA: Diagnosis not present

## 2019-09-24 DIAGNOSIS — R600 Localized edema: Secondary | ICD-10-CM | POA: Diagnosis not present

## 2019-10-01 DIAGNOSIS — Z87891 Personal history of nicotine dependence: Secondary | ICD-10-CM | POA: Diagnosis not present

## 2019-10-01 DIAGNOSIS — B370 Candidal stomatitis: Secondary | ICD-10-CM | POA: Diagnosis not present

## 2019-10-01 DIAGNOSIS — J441 Chronic obstructive pulmonary disease with (acute) exacerbation: Secondary | ICD-10-CM | POA: Diagnosis not present

## 2019-10-31 DIAGNOSIS — R21 Rash and other nonspecific skin eruption: Secondary | ICD-10-CM | POA: Diagnosis not present

## 2019-10-31 DIAGNOSIS — N898 Other specified noninflammatory disorders of vagina: Secondary | ICD-10-CM | POA: Diagnosis not present

## 2019-10-31 DIAGNOSIS — N939 Abnormal uterine and vaginal bleeding, unspecified: Secondary | ICD-10-CM | POA: Diagnosis not present

## 2019-10-31 DIAGNOSIS — L900 Lichen sclerosus et atrophicus: Secondary | ICD-10-CM | POA: Diagnosis not present

## 2019-11-25 DIAGNOSIS — Z87891 Personal history of nicotine dependence: Secondary | ICD-10-CM | POA: Diagnosis not present

## 2019-11-25 DIAGNOSIS — S81801A Unspecified open wound, right lower leg, initial encounter: Secondary | ICD-10-CM | POA: Diagnosis not present

## 2019-11-25 DIAGNOSIS — L03115 Cellulitis of right lower limb: Secondary | ICD-10-CM | POA: Diagnosis not present

## 2019-12-11 DIAGNOSIS — I714 Abdominal aortic aneurysm, without rupture: Secondary | ICD-10-CM | POA: Diagnosis not present

## 2019-12-11 DIAGNOSIS — I10 Essential (primary) hypertension: Secondary | ICD-10-CM | POA: Diagnosis not present

## 2019-12-11 DIAGNOSIS — J441 Chronic obstructive pulmonary disease with (acute) exacerbation: Secondary | ICD-10-CM | POA: Diagnosis not present

## 2019-12-11 DIAGNOSIS — I480 Paroxysmal atrial fibrillation: Secondary | ICD-10-CM | POA: Diagnosis not present

## 2019-12-11 DIAGNOSIS — I700 Atherosclerosis of aorta: Secondary | ICD-10-CM | POA: Diagnosis not present

## 2019-12-11 DIAGNOSIS — Z9989 Dependence on other enabling machines and devices: Secondary | ICD-10-CM | POA: Diagnosis not present

## 2019-12-11 DIAGNOSIS — I442 Atrioventricular block, complete: Secondary | ICD-10-CM | POA: Diagnosis not present

## 2019-12-11 DIAGNOSIS — R7302 Impaired glucose tolerance (oral): Secondary | ICD-10-CM | POA: Diagnosis not present

## 2020-01-06 DIAGNOSIS — J449 Chronic obstructive pulmonary disease, unspecified: Secondary | ICD-10-CM | POA: Diagnosis not present

## 2020-01-18 DIAGNOSIS — Z20828 Contact with and (suspected) exposure to other viral communicable diseases: Secondary | ICD-10-CM | POA: Diagnosis not present

## 2020-01-18 DIAGNOSIS — Z20822 Contact with and (suspected) exposure to covid-19: Secondary | ICD-10-CM | POA: Diagnosis not present

## 2020-01-28 DIAGNOSIS — I495 Sick sinus syndrome: Secondary | ICD-10-CM | POA: Diagnosis not present

## 2020-02-03 DIAGNOSIS — M25521 Pain in right elbow: Secondary | ICD-10-CM | POA: Diagnosis not present

## 2020-02-03 DIAGNOSIS — R600 Localized edema: Secondary | ICD-10-CM | POA: Diagnosis not present

## 2020-02-10 DIAGNOSIS — I700 Atherosclerosis of aorta: Secondary | ICD-10-CM | POA: Diagnosis not present

## 2020-02-10 DIAGNOSIS — I442 Atrioventricular block, complete: Secondary | ICD-10-CM | POA: Diagnosis not present

## 2020-02-10 DIAGNOSIS — I447 Left bundle-branch block, unspecified: Secondary | ICD-10-CM | POA: Diagnosis not present

## 2020-02-10 DIAGNOSIS — I10 Essential (primary) hypertension: Secondary | ICD-10-CM | POA: Diagnosis not present

## 2020-02-18 DIAGNOSIS — J40 Bronchitis, not specified as acute or chronic: Secondary | ICD-10-CM | POA: Diagnosis not present

## 2020-03-05 DIAGNOSIS — R058 Other specified cough: Secondary | ICD-10-CM | POA: Diagnosis not present

## 2020-03-05 DIAGNOSIS — J310 Chronic rhinitis: Secondary | ICD-10-CM | POA: Diagnosis not present

## 2020-03-05 DIAGNOSIS — J029 Acute pharyngitis, unspecified: Secondary | ICD-10-CM | POA: Diagnosis not present

## 2020-03-12 DIAGNOSIS — U071 COVID-19: Secondary | ICD-10-CM | POA: Diagnosis not present

## 2020-03-12 DIAGNOSIS — Z20828 Contact with and (suspected) exposure to other viral communicable diseases: Secondary | ICD-10-CM | POA: Diagnosis not present

## 2020-03-13 DIAGNOSIS — U071 COVID-19: Secondary | ICD-10-CM | POA: Diagnosis not present

## 2020-03-13 DIAGNOSIS — Z950 Presence of cardiac pacemaker: Secondary | ICD-10-CM | POA: Diagnosis not present

## 2020-03-13 DIAGNOSIS — I517 Cardiomegaly: Secondary | ICD-10-CM | POA: Diagnosis not present

## 2020-03-16 DIAGNOSIS — U071 COVID-19: Secondary | ICD-10-CM | POA: Diagnosis not present

## 2020-03-31 DIAGNOSIS — Z8616 Personal history of COVID-19: Secondary | ICD-10-CM | POA: Diagnosis not present

## 2020-03-31 DIAGNOSIS — J40 Bronchitis, not specified as acute or chronic: Secondary | ICD-10-CM | POA: Diagnosis not present

## 2020-05-04 DIAGNOSIS — J441 Chronic obstructive pulmonary disease with (acute) exacerbation: Secondary | ICD-10-CM | POA: Diagnosis not present

## 2020-06-15 DIAGNOSIS — R6889 Other general symptoms and signs: Secondary | ICD-10-CM | POA: Diagnosis not present

## 2020-06-15 DIAGNOSIS — Z03818 Encounter for observation for suspected exposure to other biological agents ruled out: Secondary | ICD-10-CM | POA: Diagnosis not present

## 2020-06-23 DIAGNOSIS — J40 Bronchitis, not specified as acute or chronic: Secondary | ICD-10-CM | POA: Diagnosis not present

## 2020-06-23 DIAGNOSIS — N3946 Mixed incontinence: Secondary | ICD-10-CM | POA: Diagnosis not present

## 2020-06-30 DIAGNOSIS — I442 Atrioventricular block, complete: Secondary | ICD-10-CM | POA: Diagnosis not present

## 2020-07-28 DIAGNOSIS — J309 Allergic rhinitis, unspecified: Secondary | ICD-10-CM | POA: Diagnosis not present

## 2020-08-10 DIAGNOSIS — I442 Atrioventricular block, complete: Secondary | ICD-10-CM | POA: Diagnosis not present

## 2020-08-10 DIAGNOSIS — I10 Essential (primary) hypertension: Secondary | ICD-10-CM | POA: Diagnosis not present

## 2020-08-10 DIAGNOSIS — I480 Paroxysmal atrial fibrillation: Secondary | ICD-10-CM | POA: Diagnosis not present

## 2020-08-10 DIAGNOSIS — I119 Hypertensive heart disease without heart failure: Secondary | ICD-10-CM | POA: Diagnosis not present

## 2020-08-10 DIAGNOSIS — I5022 Chronic systolic (congestive) heart failure: Secondary | ICD-10-CM | POA: Diagnosis not present

## 2020-08-10 DIAGNOSIS — I700 Atherosclerosis of aorta: Secondary | ICD-10-CM | POA: Diagnosis not present

## 2020-08-25 DIAGNOSIS — B001 Herpesviral vesicular dermatitis: Secondary | ICD-10-CM | POA: Diagnosis not present

## 2020-08-25 DIAGNOSIS — J441 Chronic obstructive pulmonary disease with (acute) exacerbation: Secondary | ICD-10-CM | POA: Diagnosis not present

## 2020-09-25 ENCOUNTER — Emergency Department: Payer: Medicare HMO

## 2020-09-25 ENCOUNTER — Other Ambulatory Visit: Payer: Self-pay

## 2020-09-25 ENCOUNTER — Emergency Department
Admission: EM | Admit: 2020-09-25 | Discharge: 2020-09-25 | Disposition: A | Discharge status: Home / Self Care | Payer: Medicare HMO | Attending: Emergency Medicine | Admitting: Emergency Medicine

## 2020-09-25 DIAGNOSIS — Z8679 Personal history of other diseases of the circulatory system: Secondary | ICD-10-CM | POA: Diagnosis not present

## 2020-09-25 DIAGNOSIS — R079 Chest pain, unspecified: Secondary | ICD-10-CM | POA: Insufficient documentation

## 2020-09-25 DIAGNOSIS — Z5321 Procedure and treatment not carried out due to patient leaving prior to being seen by health care provider: Secondary | ICD-10-CM | POA: Diagnosis not present

## 2020-09-25 LAB — BASIC METABOLIC PANEL
Anion gap: 9 (ref 5–15)
BUN: 12 mg/dL (ref 8–23)
CO2: 26 mmol/L (ref 22–32)
Calcium: 9.4 mg/dL (ref 8.9–10.3)
Chloride: 102 mmol/L (ref 98–111)
Creatinine, Ser: 0.7 mg/dL (ref 0.44–1.00)
GFR, Estimated: >60 mL/min (ref >60)
Glucose, Bld: 107 mg/dL — ABNORMAL HIGH (ref 70–99)
Potassium: 4.1 mmol/L (ref 3.5–5.1)
Sodium: 137 mmol/L (ref 135–145)

## 2020-09-25 LAB — CBC
HCT: 39.6 % (ref 36.0–46.0)
Hemoglobin: 14 g/dL (ref 12.0–15.0)
MCH: 33.6 pg (ref 26.0–34.0)
MCHC: 35.4 g/dL (ref 30.0–36.0)
MCV: 95 fL (ref 80.0–100.0)
Platelets: 170 10*3/uL (ref 150–400)
RBC: 4.17 MIL/uL (ref 3.87–5.11)
RDW: 12.9 % (ref 11.5–15.5)
WBC: 4.5 10*3/uL (ref 4.0–10.5)
nRBC: 0 % (ref 0.0–0.2)

## 2020-09-25 LAB — TROPONIN I (HIGH SENSITIVITY): Troponin I (High Sensitivity): 11 ng/L (ref <18)

## 2020-09-25 NOTE — ED Notes (Addendum)
Pt's daughter approached front desk to inform her mother is leaving does not wish to wait

## 2020-09-25 NOTE — ED Triage Notes (Signed)
Pt states that she doesn't feel right in her chest states that it has been off and on for awhile, states that it is an eery feeling. Pt states that she wanted to come be seen before she had a heart attack

## 2020-09-28 DIAGNOSIS — R600 Localized edema: Secondary | ICD-10-CM | POA: Diagnosis not present

## 2020-09-28 DIAGNOSIS — I480 Paroxysmal atrial fibrillation: Secondary | ICD-10-CM | POA: Diagnosis not present

## 2020-09-28 DIAGNOSIS — I119 Hypertensive heart disease without heart failure: Secondary | ICD-10-CM | POA: Diagnosis not present

## 2020-09-28 DIAGNOSIS — I442 Atrioventricular block, complete: Secondary | ICD-10-CM | POA: Diagnosis not present

## 2020-10-01 DIAGNOSIS — I480 Paroxysmal atrial fibrillation: Secondary | ICD-10-CM | POA: Diagnosis not present

## 2020-10-01 DIAGNOSIS — I119 Hypertensive heart disease without heart failure: Secondary | ICD-10-CM | POA: Diagnosis not present

## 2020-10-01 DIAGNOSIS — I442 Atrioventricular block, complete: Secondary | ICD-10-CM | POA: Diagnosis not present

## 2020-10-06 DIAGNOSIS — R058 Other specified cough: Secondary | ICD-10-CM | POA: Diagnosis not present

## 2020-10-06 DIAGNOSIS — R0981 Nasal congestion: Secondary | ICD-10-CM | POA: Diagnosis not present

## 2020-10-14 DIAGNOSIS — I119 Hypertensive heart disease without heart failure: Secondary | ICD-10-CM | POA: Diagnosis not present

## 2020-10-29 DIAGNOSIS — I495 Sick sinus syndrome: Secondary | ICD-10-CM | POA: Diagnosis not present

## 2020-10-29 DIAGNOSIS — I208 Other forms of angina pectoris: Secondary | ICD-10-CM | POA: Diagnosis not present

## 2020-10-29 DIAGNOSIS — I480 Paroxysmal atrial fibrillation: Secondary | ICD-10-CM | POA: Diagnosis not present

## 2020-10-29 DIAGNOSIS — I10 Essential (primary) hypertension: Secondary | ICD-10-CM | POA: Diagnosis not present

## 2020-11-12 DIAGNOSIS — R6889 Other general symptoms and signs: Secondary | ICD-10-CM | POA: Diagnosis not present

## 2020-11-12 DIAGNOSIS — Z03818 Encounter for observation for suspected exposure to other biological agents ruled out: Secondary | ICD-10-CM | POA: Diagnosis not present

## 2020-12-07 DIAGNOSIS — I208 Other forms of angina pectoris: Secondary | ICD-10-CM | POA: Diagnosis not present

## 2020-12-09 DIAGNOSIS — R058 Other specified cough: Secondary | ICD-10-CM | POA: Diagnosis not present

## 2020-12-14 DIAGNOSIS — I442 Atrioventricular block, complete: Secondary | ICD-10-CM | POA: Diagnosis not present

## 2020-12-14 DIAGNOSIS — I480 Paroxysmal atrial fibrillation: Secondary | ICD-10-CM | POA: Diagnosis not present

## 2020-12-14 DIAGNOSIS — I700 Atherosclerosis of aorta: Secondary | ICD-10-CM | POA: Diagnosis not present

## 2020-12-14 DIAGNOSIS — I119 Hypertensive heart disease without heart failure: Secondary | ICD-10-CM | POA: Diagnosis not present

## 2020-12-14 DIAGNOSIS — R600 Localized edema: Secondary | ICD-10-CM | POA: Diagnosis not present

## 2020-12-14 DIAGNOSIS — I10 Essential (primary) hypertension: Secondary | ICD-10-CM | POA: Diagnosis not present

## 2020-12-31 DIAGNOSIS — I10 Essential (primary) hypertension: Secondary | ICD-10-CM | POA: Diagnosis not present

## 2020-12-31 DIAGNOSIS — I480 Paroxysmal atrial fibrillation: Secondary | ICD-10-CM | POA: Diagnosis not present

## 2020-12-31 DIAGNOSIS — I442 Atrioventricular block, complete: Secondary | ICD-10-CM | POA: Diagnosis not present

## 2020-12-31 DIAGNOSIS — I340 Nonrheumatic mitral (valve) insufficiency: Secondary | ICD-10-CM | POA: Diagnosis not present

## 2020-12-31 DIAGNOSIS — I447 Left bundle-branch block, unspecified: Secondary | ICD-10-CM | POA: Diagnosis not present

## 2020-12-31 DIAGNOSIS — I119 Hypertensive heart disease without heart failure: Secondary | ICD-10-CM | POA: Diagnosis not present

## 2020-12-31 DIAGNOSIS — I700 Atherosclerosis of aorta: Secondary | ICD-10-CM | POA: Diagnosis not present

## 2020-12-31 DIAGNOSIS — I714 Abdominal aortic aneurysm, without rupture: Secondary | ICD-10-CM | POA: Diagnosis not present

## 2020-12-31 DIAGNOSIS — I495 Sick sinus syndrome: Secondary | ICD-10-CM | POA: Diagnosis not present

## 2021-01-07 DIAGNOSIS — I10 Essential (primary) hypertension: Secondary | ICD-10-CM | POA: Diagnosis not present

## 2021-01-07 DIAGNOSIS — I25118 Atherosclerotic heart disease of native coronary artery with other forms of angina pectoris: Secondary | ICD-10-CM | POA: Diagnosis not present

## 2021-01-07 DIAGNOSIS — I442 Atrioventricular block, complete: Secondary | ICD-10-CM | POA: Diagnosis not present

## 2021-01-07 DIAGNOSIS — R7303 Prediabetes: Secondary | ICD-10-CM | POA: Diagnosis not present

## 2021-01-07 DIAGNOSIS — I517 Cardiomegaly: Secondary | ICD-10-CM | POA: Diagnosis not present

## 2021-01-07 DIAGNOSIS — Z9989 Dependence on other enabling machines and devices: Secondary | ICD-10-CM | POA: Diagnosis not present

## 2021-01-07 DIAGNOSIS — J441 Chronic obstructive pulmonary disease with (acute) exacerbation: Secondary | ICD-10-CM | POA: Diagnosis not present

## 2021-01-07 DIAGNOSIS — I480 Paroxysmal atrial fibrillation: Secondary | ICD-10-CM | POA: Diagnosis not present

## 2021-01-07 DIAGNOSIS — J449 Chronic obstructive pulmonary disease, unspecified: Secondary | ICD-10-CM | POA: Diagnosis not present

## 2021-02-10 ENCOUNTER — Encounter: Payer: Self-pay | Admitting: Emergency Medicine

## 2021-02-10 ENCOUNTER — Emergency Department
Admission: EM | Admit: 2021-02-10 | Discharge: 2021-02-11 | Disposition: A | Discharge status: Home / Self Care | Payer: Medicare HMO | Attending: Emergency Medicine | Admitting: Emergency Medicine

## 2021-02-10 DIAGNOSIS — J449 Chronic obstructive pulmonary disease, unspecified: Secondary | ICD-10-CM | POA: Insufficient documentation

## 2021-02-10 DIAGNOSIS — I110 Hypertensive heart disease with heart failure: Secondary | ICD-10-CM | POA: Diagnosis not present

## 2021-02-10 DIAGNOSIS — J45909 Unspecified asthma, uncomplicated: Secondary | ICD-10-CM | POA: Insufficient documentation

## 2021-02-10 DIAGNOSIS — W228XXA Striking against or struck by other objects, initial encounter: Secondary | ICD-10-CM | POA: Insufficient documentation

## 2021-02-10 DIAGNOSIS — S51811A Laceration without foreign body of right forearm, initial encounter: Secondary | ICD-10-CM | POA: Insufficient documentation

## 2021-02-10 DIAGNOSIS — Z96652 Presence of left artificial knee joint: Secondary | ICD-10-CM | POA: Insufficient documentation

## 2021-02-10 DIAGNOSIS — Z23 Encounter for immunization: Secondary | ICD-10-CM | POA: Insufficient documentation

## 2021-02-10 DIAGNOSIS — Z87891 Personal history of nicotine dependence: Secondary | ICD-10-CM | POA: Diagnosis not present

## 2021-02-10 DIAGNOSIS — Z7901 Long term (current) use of anticoagulants: Secondary | ICD-10-CM | POA: Insufficient documentation

## 2021-02-10 DIAGNOSIS — I509 Heart failure, unspecified: Secondary | ICD-10-CM | POA: Insufficient documentation

## 2021-02-10 DIAGNOSIS — I4891 Unspecified atrial fibrillation: Secondary | ICD-10-CM | POA: Diagnosis not present

## 2021-02-10 DIAGNOSIS — S59911A Unspecified injury of right forearm, initial encounter: Secondary | ICD-10-CM | POA: Diagnosis present

## 2021-02-10 DIAGNOSIS — Z79899 Other long term (current) drug therapy: Secondary | ICD-10-CM | POA: Insufficient documentation

## 2021-02-10 DIAGNOSIS — Z950 Presence of cardiac pacemaker: Secondary | ICD-10-CM | POA: Insufficient documentation

## 2021-02-10 NOTE — ED Triage Notes (Signed)
Pt reports hitting her right forearm on a wooden dresser this evening in which caused apprx 1 inch skin tear. Pt to ED due to continued bleeding and on Eliquis. Area cleaned and re-dressed. Minimal bleeding on assessment.

## 2021-02-11 DIAGNOSIS — S51811A Laceration without foreign body of right forearm, initial encounter: Secondary | ICD-10-CM | POA: Diagnosis not present

## 2021-02-11 MED ORDER — BACITRACIN ZINC 500 UNIT/GM EX OINT
TOPICAL_OINTMENT | Freq: Once | CUTANEOUS | Status: AC
  Filled 2021-02-11: qty 0.9

## 2021-02-11 MED ORDER — TETANUS-DIPHTH-ACELL PERTUSSIS 5-2.5-18.5 LF-MCG/0.5 IM SUSY
0.5000 mL | PREFILLED_SYRINGE | Freq: Once | INTRAMUSCULAR | Status: AC
  Administered 2021-02-11: 0.5 mL via INTRAMUSCULAR
  Filled 2021-02-11: qty 0.5

## 2021-02-11 NOTE — Discharge Instructions (Addendum)
You may take over-the-counter Tylenol 1000 mg every 6 hours as needed for pain.  I recommend that you change the dressing to your right arm daily.  You may clean this wound gently with warm soap and water and apply over-the-counter Neosporin as needed.

## 2021-02-11 NOTE — ED Provider Notes (Signed)
[facility] Emergency Department Provider Note  ____________________________________________   Event Date/Time   First MD Initiated Contact with Patient 02/10/21 2351     (approximate)  I have reviewed the triage vital signs and the nursing notes.   HISTORY  Chief Complaint Arm Injury    HPI Natalie Schneider is a 87 y.o. female right-hand-dominant female history of atrial fibrillation, CHF, COPD, hypertension who presents to the emergency department with a skin tear to the right forearm that occurred this morning.  States she accidentally hit it against a Child psychotherapist.  Patient and family state that they had a hard time getting it to stop bleeding because she is on Eliquis.  She is unsure of her last tetanus vaccination.  No other injury.  Did not fall to the ground or hit her head.      R forearm injury this morning Hit against dresser Couldn't get it to stop bleeding On eliquis RHD Unsure of last tetanus No fall   Past Medical History:  Diagnosis Date   Arthritis    Asthma    Atrial fibrillation (HCC)    CHF (congestive heart failure) (HCC)    COPD (chronic obstructive pulmonary disease) (HCC)    Cough    Environmental allergies    GERD (gastroesophageal reflux disease)    Hypertension    Vertigo     Patient Active Problem List   Diagnosis Date Noted   Near syncope 04/16/2015   Heart block 04/02/2015   Complete heart block (HCC) 04/01/2015   S/P total knee replacement 11/05/2014    Past Surgical History:  Procedure Laterality Date   BREAST BIOPSY Left    cataracts Bilateral    LAPAROSCOPIC OOPHERECTOMY Bilateral    PACEMAKER INSERTION Left 04/03/2015   Procedure: INSERTION PACEMAKER;  Surgeon: Sharion Settler, MD;  Location: [facility] ORS;  Service: Cardiovascular;  Laterality: Left;   THYROID SURGERY     TOTAL KNEE ARTHROPLASTY Left 11/05/2014   Procedure: TOTAL KNEE ARTHROPLASTY;  Surgeon: Erin Sons, MD;  Location: [facility] ORS;   Service: Orthopedics;  Laterality: Left;    Prior to Admission medications   Medication Sig Start Date End Date Taking? Authorizing Provider  acetaminophen (TYLENOL) 325 MG tablet Take 2 tablets (650 mg total) by mouth every 4 (four) hours as needed for mild pain. 04/04/15   Auburn Bilberry, MD  ALPRAZolam Prudy Feeler) 0.25 MG tablet Take 0.25 mg by mouth at bedtime as needed for sleep.     [provider]  apixaban (ELIQUIS) 5 MG TABS tablet Take 5 mg by mouth 2 (two) times daily.    [provider]  busPIRone (BUSPAR) 5 MG tablet Take 10 mg by mouth 2 (two) times daily.     [provider]  digoxin (LANOXIN) 0.25 MG tablet Take 0.125 mg by mouth daily.     [provider]  diphenhydrAMINE (BENADRYL) 25 MG tablet Take 25 mg by mouth at bedtime.     [provider]  fluticasone (FLONASE) 50 MCG/ACT nasal spray Place 2 sprays into both nostrils at bedtime as needed for rhinitis.     [provider]  furosemide (LASIX) 20 MG tablet Take 20 mg by mouth daily as needed for edema.    [provider]  loperamide (IMODIUM) 2 MG capsule Take 2 mg by mouth as needed for diarrhea or loose stools.     [provider]  losartan (COZAAR) 100 MG tablet Take 1 tablet (100 mg total) by mouth  daily. 04/17/15   Alford Highland, MD  meclizine (ANTIVERT) 25 MG tablet Take 1 tablet (25 mg total) by mouth 3 (three) times daily as needed for dizziness. 03/22/16   Rockne Menghini, MD  metoprolol succinate (TOPROL-XL) 100 MG 24 hr tablet Take 1 tablet (100 mg total) by mouth daily. Take with or immediately following a meal. 04/17/15   Alford Highland, MD  montelukast (SINGULAIR) 10 MG tablet Take 10 mg by mouth at bedtime.    [provider]  ondansetron (ZOFRAN ODT) 4 MG disintegrating tablet Take 1 tablet (4 mg total) by mouth every 8 (eight) hours as needed for nausea or vomiting. 03/22/16   Rockne Menghini, MD  pantoprazole  (PROTONIX) 40 MG tablet Take 40 mg by mouth 2 (two) times daily.    [provider]  raloxifene (EVISTA) 60 MG tablet Take 60 mg by mouth daily.    [provider]    Allergies Amoxicillin, Augmentin [amoxicillin-pot clavulanate], Ciprofloxacin, Clindamycin/lincomycin, Dicyclomine, Doxycycline, Flagyl [metronidazole], Levaquin [levofloxacin in d5w], and Septra [sulfamethoxazole-trimethoprim]  Family History  Problem Relation Age of Onset   Heart disease Mother    Heart disease Father    Cancer Sister    Cancer Brother     Social History Social History   Tobacco Use   Smoking status: Former    Types: Cigarettes    Quit date: 10/23/1974    Years since quitting: 46.3   Smokeless tobacco: Never  Substance Use Topics   Alcohol use: No   Drug use: No    Review of Systems Constitutional: No fever. Eyes: No visual changes. ENT: No sore throat. Cardiovascular: Denies chest pain. Respiratory: Denies shortness of breath. Gastrointestinal: No nausea, vomiting, diarrhea. Genitourinary: Negative for dysuria. Musculoskeletal: Negative for back pain. Skin: Negative for rash. Neurological: Negative for focal weakness or numbness.  ____________________________________________   PHYSICAL EXAM:  VITAL SIGNS: ED Triage Vitals [02/10/21 2120]  Enc Vitals Group     BP (!) 161/79     Pulse Rate 78     Resp 17     Temp 98.2 F (36.8 C)     Temp Source Oral     SpO2 97 %     Weight      Height      Head Circumference      Peak Flow      Pain Score      Pain Loc      Pain Edu?      Excl. in GC?    CONSTITUTIONAL: Alert and oriented and responds appropriately to questions. Well-appearing; well-nourished HEAD: Normocephalic EYES: Conjunctivae clear, pupils appear equal, EOM appear intact ENT: normal nose; moist mucous membranes NECK: Supple, normal ROM CARD: RRR; S1 and S2 appreciated; no murmurs, no clicks, no rubs, no gallops RESP: Normal chest excursion  without splinting or tachypnea; breath sounds clear and equal bilaterally; no wheezes, no rhonchi, no rales, no hypoxia or respiratory distress, speaking full sentences ABD/GI: Normal bowel sounds; non-distended; soft, non-tender, no rebound, no guarding, no peritoneal signs, no hepatosplenomegaly BACK: The back appears normal EXT: Normal ROM in all joints; no deformity noted, no edema; no cyanosis; 3 cm superficial skin tear to the mid dorsal right forearm with small amount of surrounding bruising.  No bony tenderness or bony deformity.  No joint effusions.  2+ right radial pulse, normal capillary refill and normal sensation of the right arm. SKIN: Normal color for age and race; warm; no rash on exposed skin NEURO: Moves all extremities  equally PSYCH: The patient's mood and manner are appropriate.  ____________________________________________   LABS (all labs ordered are listed, but only abnormal results are displayed)  Labs Reviewed - No data to display ____________________________________________  EKG   ____________________________________________  RADIOLOGY I, Emilianna Barlowe, personally viewed and evaluated these images (plain radiographs) as part of my medical decision making, as well as reviewing the written report by the radiologist.  ED MD interpretation:    Official radiology report(s): No results found.  ____________________________________________   PROCEDURES  Procedure(s) performed (including Critical Care):  Procedures   ____________________________________________   INITIAL IMPRESSION / ASSESSMENT AND PLAN / ED COURSE  As part of my medical decision making, I reviewed the following data within the electronic MEDICAL RECORD NUMBER History obtained from family, Old chart reviewed, and Notes from prior ED visits         Patient here with a skin tear to the right arm that family was having a hard time getting hemostatic at home given she is on Eliquis.  Pressure  dressing applied in triage and now it is not actively bleeding.  Neurovascular intact distally.  Wound is very superficial without signs of foreign body, tendon involvement.  No bony tenderness or deformity on exam.  Denies any other injury.  Tetanus vaccination updated today in the emergency department.  Wound cleaned and Steri-Strips applied.  Placed another pressure bandage with instructions to remove this at home and 12 hours and wound care instructions.  Patient and family comfortable with this plan.  At this time, I do not feel there is any life-threatening condition present. I have reviewed, interpreted and discussed all results (EKG, imaging, lab, urine as appropriate) and exam findings with patient/family. I have reviewed nursing notes and appropriate previous records.  I feel the patient is safe to be discharged home without further emergent workup and can continue workup as an outpatient as needed. Discussed usual and customary return precautions. Patient/family verbalize understanding and are comfortable with this plan.  Outpatient follow-up has been provided as needed. All questions have been answered.  ____________________________________________   FINAL CLINICAL IMPRESSION(S) / ED DIAGNOSES  Final diagnoses:  Skin tear of right forearm without complication, initial encounter     ED Discharge Orders     None       *Please note:  Natalie Schneider was evaluated in Emergency Department on 02/11/2021 for the symptoms described in the history of present illness. She was evaluated in the context of the global COVID-19 pandemic, which necessitated consideration that the patient might be at risk for infection with the SARS-CoV-2 virus that causes COVID-19. Institutional protocols and algorithms that pertain to the evaluation of patients at risk for COVID-19 are in a state of rapid change based on information released by regulatory bodies including the CDC and federal and state  organizations. These policies and algorithms were followed during the patient's care in the ED.  Some ED evaluations and interventions may be delayed as a result of limited staffing during and the pandemic.*   Note:  This document was prepared using Dragon voice recognition software and may include unintentional dictation errors.    Kaheem Halleck, Layla Maw, DO 02/11/21 [id number]

## 2021-03-23 DIAGNOSIS — J40 Bronchitis, not specified as acute or chronic: Secondary | ICD-10-CM | POA: Diagnosis not present

## 2021-03-23 DIAGNOSIS — Z03818 Encounter for observation for suspected exposure to other biological agents ruled out: Secondary | ICD-10-CM | POA: Diagnosis not present

## 2021-03-31 DIAGNOSIS — I25118 Atherosclerotic heart disease of native coronary artery with other forms of angina pectoris: Secondary | ICD-10-CM | POA: Diagnosis not present

## 2021-03-31 DIAGNOSIS — R7303 Prediabetes: Secondary | ICD-10-CM | POA: Diagnosis not present

## 2021-03-31 DIAGNOSIS — Z0000 Encounter for general adult medical examination without abnormal findings: Secondary | ICD-10-CM | POA: Diagnosis not present

## 2021-03-31 DIAGNOSIS — Z9989 Dependence on other enabling machines and devices: Secondary | ICD-10-CM | POA: Diagnosis not present

## 2021-03-31 DIAGNOSIS — J441 Chronic obstructive pulmonary disease with (acute) exacerbation: Secondary | ICD-10-CM | POA: Diagnosis not present

## 2021-03-31 DIAGNOSIS — Z23 Encounter for immunization: Secondary | ICD-10-CM | POA: Diagnosis not present

## 2021-03-31 DIAGNOSIS — I714 Abdominal aortic aneurysm, without rupture, unspecified: Secondary | ICD-10-CM | POA: Diagnosis not present

## 2021-04-26 DIAGNOSIS — J441 Chronic obstructive pulmonary disease with (acute) exacerbation: Secondary | ICD-10-CM | POA: Diagnosis not present

## 2021-04-30 DIAGNOSIS — I714 Abdominal aortic aneurysm, without rupture, unspecified: Secondary | ICD-10-CM | POA: Diagnosis not present

## 2021-04-30 DIAGNOSIS — I700 Atherosclerosis of aorta: Secondary | ICD-10-CM | POA: Diagnosis not present

## 2021-04-30 DIAGNOSIS — I10 Essential (primary) hypertension: Secondary | ICD-10-CM | POA: Diagnosis not present

## 2021-04-30 DIAGNOSIS — I25118 Atherosclerotic heart disease of native coronary artery with other forms of angina pectoris: Secondary | ICD-10-CM | POA: Diagnosis not present

## 2021-04-30 DIAGNOSIS — I442 Atrioventricular block, complete: Secondary | ICD-10-CM | POA: Diagnosis not present

## 2021-05-21 DIAGNOSIS — R6889 Other general symptoms and signs: Secondary | ICD-10-CM | POA: Diagnosis not present

## 2021-05-21 DIAGNOSIS — Z03818 Encounter for observation for suspected exposure to other biological agents ruled out: Secondary | ICD-10-CM | POA: Diagnosis not present

## 2021-06-01 DIAGNOSIS — I442 Atrioventricular block, complete: Secondary | ICD-10-CM | POA: Diagnosis not present

## 2021-06-25 DIAGNOSIS — Z03818 Encounter for observation for suspected exposure to other biological agents ruled out: Secondary | ICD-10-CM | POA: Diagnosis not present

## 2021-06-25 DIAGNOSIS — R6889 Other general symptoms and signs: Secondary | ICD-10-CM | POA: Diagnosis not present

## 2021-08-11 DIAGNOSIS — R58 Hemorrhage, not elsewhere classified: Secondary | ICD-10-CM | POA: Diagnosis not present

## 2021-08-11 DIAGNOSIS — J40 Bronchitis, not specified as acute or chronic: Secondary | ICD-10-CM | POA: Diagnosis not present

## 2021-08-26 IMAGING — CR DG CHEST 2V
1 series · 2 of 2 positions shown · non-contrast
Comparison: Chest x-ray 06/09/2018, CT chest 04/09/2019

CLINICAL DATA: Chest pain.

EXAM:
CHEST - 2 VIEW

[Series 1: dg chest 2 view · 0.14mm/px · 2 of 2 slices shown]
[im 1/2]
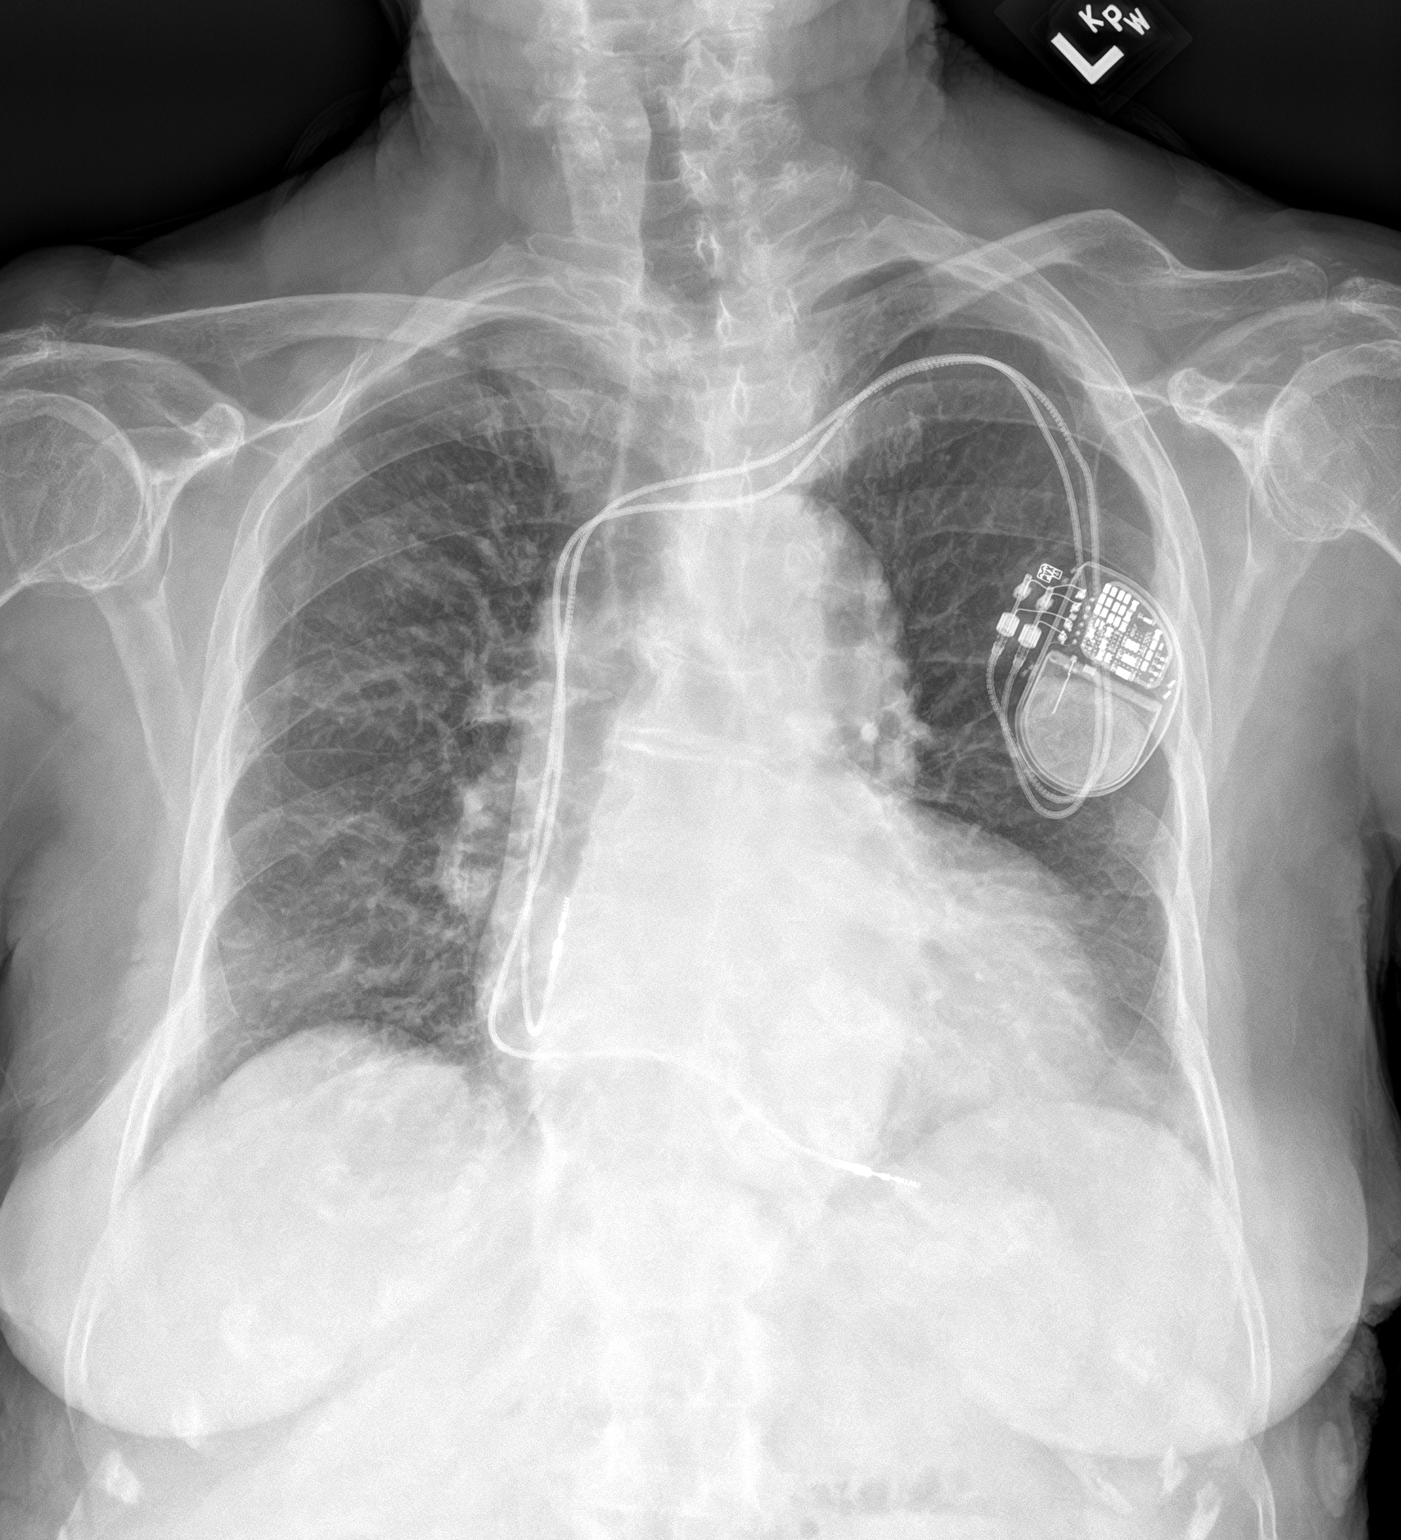
[im 2/2]
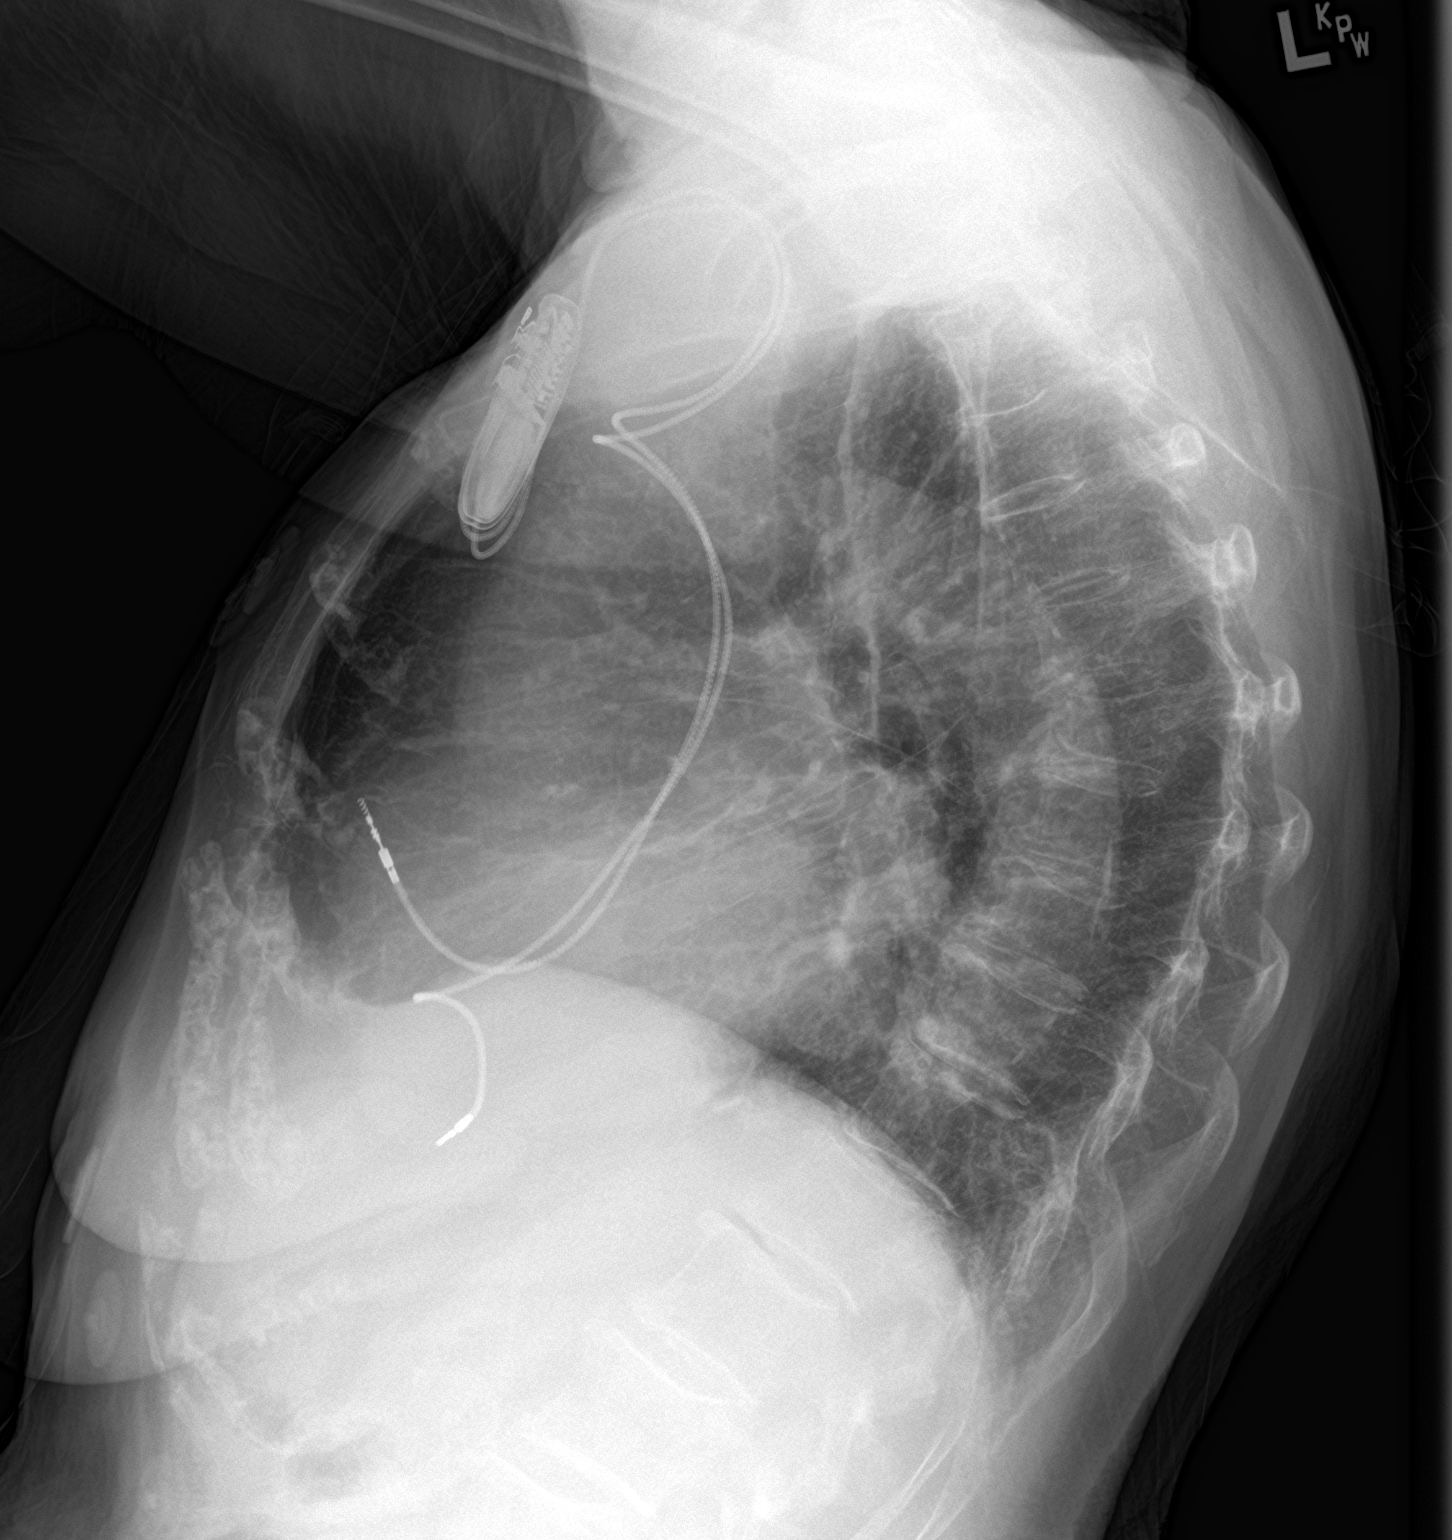

[2 of 2 positions shown; findings below may reference images not displayed]

FINDINGS: The heart size and mediastinal contours are unchanged. Left chest
wall cardiac pacemaker with 2 leads in stable position.

Redemonstration of a right apex 3 mm subpleural nodule. Rounded
subcentimeter densities on lateral view likely vessel en face . No
focal consolidation. Redemonstration of coarsened interstitial
markings. No pleural effusion. No pneumothorax.

Bilateral shoulder degenerative changes.
IMPRESSION: No active cardiopulmonary disease.

## 2021-09-09 DIAGNOSIS — R6889 Other general symptoms and signs: Secondary | ICD-10-CM | POA: Diagnosis not present

## 2021-09-09 DIAGNOSIS — R053 Chronic cough: Secondary | ICD-10-CM | POA: Diagnosis not present

## 2021-09-16 DIAGNOSIS — L03116 Cellulitis of left lower limb: Secondary | ICD-10-CM | POA: Diagnosis not present

## 2021-09-16 DIAGNOSIS — S81802A Unspecified open wound, left lower leg, initial encounter: Secondary | ICD-10-CM | POA: Diagnosis not present

## 2021-09-20 DIAGNOSIS — L539 Erythematous condition, unspecified: Secondary | ICD-10-CM | POA: Diagnosis not present

## 2021-09-20 DIAGNOSIS — M7989 Other specified soft tissue disorders: Secondary | ICD-10-CM | POA: Diagnosis not present

## 2021-09-28 DIAGNOSIS — J441 Chronic obstructive pulmonary disease with (acute) exacerbation: Secondary | ICD-10-CM | POA: Diagnosis not present

## 2021-09-28 DIAGNOSIS — I10 Essential (primary) hypertension: Secondary | ICD-10-CM | POA: Diagnosis not present

## 2021-09-28 DIAGNOSIS — I442 Atrioventricular block, complete: Secondary | ICD-10-CM | POA: Diagnosis not present

## 2021-09-28 DIAGNOSIS — I25118 Atherosclerotic heart disease of native coronary artery with other forms of angina pectoris: Secondary | ICD-10-CM | POA: Diagnosis not present

## 2021-10-01 DIAGNOSIS — M25561 Pain in right knee: Secondary | ICD-10-CM | POA: Diagnosis not present

## 2021-10-01 DIAGNOSIS — M25361 Other instability, right knee: Secondary | ICD-10-CM | POA: Diagnosis not present

## 2021-10-01 DIAGNOSIS — Z96651 Presence of right artificial knee joint: Secondary | ICD-10-CM | POA: Diagnosis not present

## 2021-10-04 DIAGNOSIS — R300 Dysuria: Secondary | ICD-10-CM | POA: Diagnosis not present

## 2021-10-04 DIAGNOSIS — R197 Diarrhea, unspecified: Secondary | ICD-10-CM | POA: Diagnosis not present

## 2021-10-19 DIAGNOSIS — M7989 Other specified soft tissue disorders: Secondary | ICD-10-CM | POA: Diagnosis not present

## 2021-10-19 DIAGNOSIS — J40 Bronchitis, not specified as acute or chronic: Secondary | ICD-10-CM | POA: Diagnosis not present

## 2021-10-19 DIAGNOSIS — S81802A Unspecified open wound, left lower leg, initial encounter: Secondary | ICD-10-CM | POA: Diagnosis not present

## 2021-10-25 DIAGNOSIS — S81802A Unspecified open wound, left lower leg, initial encounter: Secondary | ICD-10-CM | POA: Diagnosis not present

## 2021-10-25 DIAGNOSIS — R059 Cough, unspecified: Secondary | ICD-10-CM | POA: Diagnosis not present

## 2021-10-25 DIAGNOSIS — J449 Chronic obstructive pulmonary disease, unspecified: Secondary | ICD-10-CM | POA: Diagnosis not present

## 2021-10-25 DIAGNOSIS — J441 Chronic obstructive pulmonary disease with (acute) exacerbation: Secondary | ICD-10-CM | POA: Diagnosis not present

## 2021-11-03 DIAGNOSIS — Z96651 Presence of right artificial knee joint: Secondary | ICD-10-CM | POA: Diagnosis not present

## 2021-11-03 DIAGNOSIS — I442 Atrioventricular block, complete: Secondary | ICD-10-CM | POA: Diagnosis not present

## 2021-11-24 DIAGNOSIS — M25361 Other instability, right knee: Secondary | ICD-10-CM | POA: Diagnosis not present

## 2021-11-24 DIAGNOSIS — Z96651 Presence of right artificial knee joint: Secondary | ICD-10-CM | POA: Diagnosis not present

## 2021-12-06 DIAGNOSIS — R197 Diarrhea, unspecified: Secondary | ICD-10-CM | POA: Diagnosis not present

## 2021-12-06 DIAGNOSIS — R1084 Generalized abdominal pain: Secondary | ICD-10-CM | POA: Diagnosis not present

## 2021-12-22 DIAGNOSIS — J441 Chronic obstructive pulmonary disease with (acute) exacerbation: Secondary | ICD-10-CM | POA: Diagnosis not present

## 2021-12-22 DIAGNOSIS — R197 Diarrhea, unspecified: Secondary | ICD-10-CM | POA: Diagnosis not present

## 2021-12-27 DIAGNOSIS — R197 Diarrhea, unspecified: Secondary | ICD-10-CM | POA: Diagnosis not present

## 2022-01-12 DIAGNOSIS — R600 Localized edema: Secondary | ICD-10-CM | POA: Diagnosis not present

## 2022-01-12 DIAGNOSIS — A029 Salmonella infection, unspecified: Secondary | ICD-10-CM | POA: Diagnosis not present

## 2022-01-17 ENCOUNTER — Other Ambulatory Visit: Payer: Self-pay

## 2022-01-17 ENCOUNTER — Emergency Department
Admission: EM | Admit: 2022-01-17 | Discharge: 2022-01-17 | Disposition: A | Discharge status: Home / Self Care | Payer: Medicare HMO | Attending: Emergency Medicine | Admitting: Emergency Medicine

## 2022-01-17 DIAGNOSIS — J449 Chronic obstructive pulmonary disease, unspecified: Secondary | ICD-10-CM | POA: Diagnosis not present

## 2022-01-17 DIAGNOSIS — R600 Localized edema: Secondary | ICD-10-CM | POA: Diagnosis not present

## 2022-01-17 DIAGNOSIS — R609 Edema, unspecified: Secondary | ICD-10-CM

## 2022-01-17 DIAGNOSIS — I110 Hypertensive heart disease with heart failure: Secondary | ICD-10-CM | POA: Insufficient documentation

## 2022-01-17 DIAGNOSIS — E876 Hypokalemia: Secondary | ICD-10-CM | POA: Insufficient documentation

## 2022-01-17 DIAGNOSIS — J45909 Unspecified asthma, uncomplicated: Secondary | ICD-10-CM | POA: Diagnosis not present

## 2022-01-17 DIAGNOSIS — I5030 Unspecified diastolic (congestive) heart failure: Secondary | ICD-10-CM | POA: Insufficient documentation

## 2022-01-17 DIAGNOSIS — Z96653 Presence of artificial knee joint, bilateral: Secondary | ICD-10-CM | POA: Insufficient documentation

## 2022-01-17 DIAGNOSIS — M7989 Other specified soft tissue disorders: Secondary | ICD-10-CM | POA: Diagnosis present

## 2022-01-17 LAB — COMPREHENSIVE METABOLIC PANEL
ALT: 12 U/L (ref 0–44)
AST: 29 U/L (ref 15–41)
Albumin: 3.9 g/dL (ref 3.5–5.0)
Alkaline Phosphatase: 45 U/L (ref 38–126)
Anion gap: 7 (ref 5–15)
BUN: 10 mg/dL (ref 8–23)
CO2: 30 mmol/L (ref 22–32)
Calcium: 9.3 mg/dL (ref 8.9–10.3)
Chloride: 101 mmol/L (ref 98–111)
Creatinine, Ser: 0.89 mg/dL (ref 0.44–1.00)
GFR, Estimated: 59 mL/min — ABNORMAL LOW (ref >60)
Glucose, Bld: 131 mg/dL — ABNORMAL HIGH (ref 70–99)
Potassium: 3.3 mmol/L — ABNORMAL LOW (ref 3.5–5.1)
Sodium: 138 mmol/L (ref 135–145)
Total Bilirubin: 0.9 mg/dL (ref 0.3–1.2)
Total Protein: 6.7 g/dL (ref 6.5–8.1)

## 2022-01-17 LAB — CBC
HCT: 35.3 % — ABNORMAL LOW (ref 36.0–46.0)
Hemoglobin: 12.2 g/dL (ref 12.0–15.0)
MCH: 32.9 pg (ref 26.0–34.0)
MCHC: 34.6 g/dL (ref 30.0–36.0)
MCV: 95.1 fL (ref 80.0–100.0)
Platelets: 208 10*3/uL (ref 150–400)
RBC: 3.71 MIL/uL — ABNORMAL LOW (ref 3.87–5.11)
RDW: 12.5 % (ref 11.5–15.5)
WBC: 4.7 10*3/uL (ref 4.0–10.5)
nRBC: 0 % (ref 0.0–0.2)

## 2022-01-17 LAB — BRAIN NATRIURETIC PEPTIDE: B Natriuretic Peptide: 280.9 pg/mL — ABNORMAL HIGH (ref 0.0–100.0)

## 2022-01-17 MED ORDER — POTASSIUM CHLORIDE CRYS ER 20 MEQ PO TBCR
40.0000 meq | EXTENDED_RELEASE_TABLET | Freq: Once | ORAL | Status: AC
  Administered 2022-01-17: 40 meq via ORAL
  Filled 2022-01-17: qty 2

## 2022-01-17 NOTE — ED Notes (Signed)
Ace bandage applied to R lower leg

## 2022-01-17 NOTE — Discharge Instructions (Addendum)
Please take your Lasix twice a day for the next 5 days, and then follow-up with your primary care provider for further instructions.  Please follow-up with your cardiologist.  If you are developing shortness of breath please return to the emergency department.

## 2022-01-17 NOTE — ED Provider Notes (Signed)
[facility] Provider Note    Event Date/Time   First MD Initiated Contact with Patient 01/17/22 1336     (approximate)   History   Leg Swelling   HPI  Natalie Schneider is a 88 y.o. female past medical history of COPD, paroxysmal A-fib, hypertension, heart failure preserved ejection fraction who presents with lower extremity swelling.  Patient has noticed swelling for the past 6 days.  She does have some chronic edema but is worse than normal.  She has chronic cough and saw her primary doctor 5 days ago and was prescribed cefdinir for presumed bronchitis.  She however denies any shortness of breath orthopnea.  She has been taking her Lasix daily.  She was working out in the garden today when she thinks she hit the right leg and is now having some oozing from the right lower extremity.  Elevate her legs she has not been able to wear compression stockings because they do not fit anymore.     Past Medical History:  Diagnosis Date   Arthritis    Asthma    Atrial fibrillation (HCC)    CHF (congestive heart failure) (HCC)    COPD (chronic obstructive pulmonary disease) (HCC)    Cough    Environmental allergies    GERD (gastroesophageal reflux disease)    Hypertension    Vertigo     Patient Active Problem List   Diagnosis Date Noted   Near syncope 04/16/2015   Heart block 04/02/2015   Complete heart block (HCC) 04/01/2015   S/P total knee replacement 11/05/2014     Physical Exam  Triage Vital Signs: ED Triage Vitals  Enc Vitals Group     BP 01/17/22 1210 (!) 173/78     Pulse Rate 01/17/22 1210 90     Resp 01/17/22 1210 18     Temp 01/17/22 1210 98.3 F (36.8 C)     Temp Source 01/17/22 1210 Oral     SpO2 01/17/22 1210 93 %     Weight 01/17/22 1211 145 lb 1 oz (65.8 kg)     Height 01/17/22 1211 4\' 11"  (1.499 m)     Head Circumference --      Peak Flow --      Pain Score 01/17/22 1210 0     Pain Loc --      Pain Edu? --      Excl. in GC? --      Most recent vital signs: Vitals:   01/17/22 1210 01/17/22 1408  BP: (!) 173/78 (!) 156/74  Pulse: 90 68  Resp: 18 18  Temp: 98.3 F (36.8 C) 97.7 F (36.5 C)  SpO2: 93% 98%     General: Awake, no distress.  CV:  Good peripheral perfusion.  2+ pitting edema bilateral lower extremities with chronic venous stasis changes, there is some serous oozing from the right lower extremity, 2+ DP pulses bilaterally Resp:  Normal effort.  Lungs are clear no increased work of breathing Abd:  No distention.  Neuro:             Awake, Alert, Oriented x 3  Other:     ED Results / Procedures / Treatments  Labs (all labs ordered are listed, but only abnormal results are displayed) Labs Reviewed  CBC - Abnormal; Notable for the following components:      Result Value   RBC 3.71 (*)    HCT 35.3 (*)    All other components within normal limits  COMPREHENSIVE METABOLIC PANEL - Abnormal; Notable for the following components:   Potassium 3.3 (*)    Glucose, Bld 131 (*)    GFR, Estimated 59 (*)    All other components within normal limits  BRAIN NATRIURETIC PEPTIDE - Abnormal; Notable for the following components:   B Natriuretic Peptide 280.9 (*)    All other components within normal limits     EKG     RADIOLOGY    PROCEDURES:  Critical Care performed: No  Procedures  The patient is on the cardiac monitor to evaluate for evidence of arrhythmia and/or significant heart rate changes.   MEDICATIONS ORDERED IN ED: Medications  potassium chloride SA (KLOR-CON M) CR tablet 40 mEq (40 mEq Oral Given 01/17/22 1527)     IMPRESSION / MDM / ASSESSMENT AND PLAN / ED COURSE  I reviewed the triage vital signs and the nursing notes.                              Patient's presentation is most consistent with acute complicated illness / injury requiring diagnostic workup.  Differential diagnosis includes, but is not limited to, heart failure, venous stasis, heat related edema, renal  failure  The patient is a 88 year old female with history of heart failure preserved EF presents today with lower extremity swelling.  She is having a cough that is rather chronic and has been treated for bronchitis but denies shortness of breath.  Does take Lasix daily for history of HFpEF.  On exam she does have pitting edema but the legs are symmetric there are chronic venous stasis changes and some serous oozing from the right side.  She has good pulses.  Her lungs are clear she is not in any respiratory distress and is satting well.  Blood work is notable for mild hypokalemia which was supplemented orally and BNP mildly elevated at 280.  I suspect that her edema is in the setting of heart failure potentially right-sided given her history of COPD versus venous stasis.  Patient has been working outside and is on her feet a lot she could be contributing.  Given she is not having any respiratory symptoms do not feel that she needs IV diuresis and inpatient admission at this time.  Recommended that she double her dose of Lasix for the next 5 days and then follow-up with her PCP for further recommendations.  Return to the ED for shortness of breath.  I stressed the importance of elevation and finding a pair compression stockings that fit.        FINAL CLINICAL IMPRESSION(S) / ED DIAGNOSES   Final diagnoses:  Peripheral edema     Rx / DC Orders   ED Discharge Orders     None        Note:  This document was prepared using Dragon voice recognition software and may include unintentional dictation errors.   Georga Hacking, MD 01/17/22 1538

## 2022-01-17 NOTE — ED Triage Notes (Signed)
Pt here with bilateral leg swelling for a few days. Pt denies cp, n/v/d.

## 2022-01-19 DIAGNOSIS — I5032 Chronic diastolic (congestive) heart failure: Secondary | ICD-10-CM | POA: Diagnosis not present

## 2022-01-26 DIAGNOSIS — R600 Localized edema: Secondary | ICD-10-CM | POA: Diagnosis not present

## 2022-02-01 DIAGNOSIS — I5032 Chronic diastolic (congestive) heart failure: Secondary | ICD-10-CM | POA: Diagnosis not present

## 2022-03-02 DIAGNOSIS — J441 Chronic obstructive pulmonary disease with (acute) exacerbation: Secondary | ICD-10-CM | POA: Diagnosis not present

## 2022-03-24 DIAGNOSIS — H16223 Keratoconjunctivitis sicca, not specified as Sjogren's, bilateral: Secondary | ICD-10-CM | POA: Diagnosis not present

## 2022-04-04 DIAGNOSIS — Z889 Allergy status to unspecified drugs, medicaments and biological substances status: Secondary | ICD-10-CM | POA: Diagnosis not present

## 2022-04-04 DIAGNOSIS — I110 Hypertensive heart disease with heart failure: Secondary | ICD-10-CM | POA: Diagnosis not present

## 2022-04-04 DIAGNOSIS — Z1331 Encounter for screening for depression: Secondary | ICD-10-CM | POA: Diagnosis not present

## 2022-04-04 DIAGNOSIS — I714 Abdominal aortic aneurysm, without rupture, unspecified: Secondary | ICD-10-CM | POA: Diagnosis not present

## 2022-04-04 DIAGNOSIS — Z0000 Encounter for general adult medical examination without abnormal findings: Secondary | ICD-10-CM | POA: Diagnosis not present

## 2022-04-04 DIAGNOSIS — I5032 Chronic diastolic (congestive) heart failure: Secondary | ICD-10-CM | POA: Diagnosis not present

## 2022-04-04 DIAGNOSIS — I25118 Atherosclerotic heart disease of native coronary artery with other forms of angina pectoris: Secondary | ICD-10-CM | POA: Diagnosis not present

## 2022-04-04 DIAGNOSIS — I442 Atrioventricular block, complete: Secondary | ICD-10-CM | POA: Diagnosis not present

## 2022-04-04 DIAGNOSIS — J449 Chronic obstructive pulmonary disease, unspecified: Secondary | ICD-10-CM | POA: Diagnosis not present

## 2022-04-05 DIAGNOSIS — H524 Presbyopia: Secondary | ICD-10-CM | POA: Diagnosis not present

## 2022-04-05 DIAGNOSIS — Z961 Presence of intraocular lens: Secondary | ICD-10-CM | POA: Diagnosis not present

## 2022-04-12 DIAGNOSIS — W19XXXA Unspecified fall, initial encounter: Secondary | ICD-10-CM | POA: Diagnosis not present

## 2022-04-12 DIAGNOSIS — M1612 Unilateral primary osteoarthritis, left hip: Secondary | ICD-10-CM | POA: Diagnosis not present

## 2022-04-12 DIAGNOSIS — Y92009 Unspecified place in unspecified non-institutional (private) residence as the place of occurrence of the external cause: Secondary | ICD-10-CM | POA: Diagnosis not present

## 2022-04-12 DIAGNOSIS — M25552 Pain in left hip: Secondary | ICD-10-CM | POA: Diagnosis not present

## 2022-04-26 DIAGNOSIS — R21 Rash and other nonspecific skin eruption: Secondary | ICD-10-CM | POA: Diagnosis not present

## 2022-04-26 DIAGNOSIS — I442 Atrioventricular block, complete: Secondary | ICD-10-CM | POA: Diagnosis not present

## 2022-05-24 DIAGNOSIS — I442 Atrioventricular block, complete: Secondary | ICD-10-CM | POA: Diagnosis not present

## 2022-05-24 DIAGNOSIS — I5032 Chronic diastolic (congestive) heart failure: Secondary | ICD-10-CM | POA: Diagnosis not present

## 2022-05-24 DIAGNOSIS — I714 Abdominal aortic aneurysm, without rupture, unspecified: Secondary | ICD-10-CM | POA: Diagnosis not present

## 2022-05-24 DIAGNOSIS — I25118 Atherosclerotic heart disease of native coronary artery with other forms of angina pectoris: Secondary | ICD-10-CM | POA: Diagnosis not present

## 2022-05-24 DIAGNOSIS — J441 Chronic obstructive pulmonary disease with (acute) exacerbation: Secondary | ICD-10-CM | POA: Diagnosis not present

## 2022-05-24 DIAGNOSIS — I480 Paroxysmal atrial fibrillation: Secondary | ICD-10-CM | POA: Diagnosis not present

## 2022-06-07 DIAGNOSIS — I25118 Atherosclerotic heart disease of native coronary artery with other forms of angina pectoris: Secondary | ICD-10-CM | POA: Diagnosis not present

## 2022-06-07 DIAGNOSIS — I442 Atrioventricular block, complete: Secondary | ICD-10-CM | POA: Diagnosis not present

## 2022-06-07 DIAGNOSIS — I5032 Chronic diastolic (congestive) heart failure: Secondary | ICD-10-CM | POA: Diagnosis not present

## 2022-06-07 DIAGNOSIS — I480 Paroxysmal atrial fibrillation: Secondary | ICD-10-CM | POA: Diagnosis not present

## 2022-06-07 DIAGNOSIS — I447 Left bundle-branch block, unspecified: Secondary | ICD-10-CM | POA: Diagnosis not present

## 2022-06-07 DIAGNOSIS — I714 Abdominal aortic aneurysm, without rupture, unspecified: Secondary | ICD-10-CM | POA: Diagnosis not present

## 2022-06-07 DIAGNOSIS — I10 Essential (primary) hypertension: Secondary | ICD-10-CM | POA: Diagnosis not present

## 2022-06-07 DIAGNOSIS — I700 Atherosclerosis of aorta: Secondary | ICD-10-CM | POA: Diagnosis not present

## 2022-06-07 DIAGNOSIS — I495 Sick sinus syndrome: Secondary | ICD-10-CM | POA: Diagnosis not present

## 2022-06-14 DIAGNOSIS — Z03818 Encounter for observation for suspected exposure to other biological agents ruled out: Secondary | ICD-10-CM | POA: Diagnosis not present

## 2022-06-14 DIAGNOSIS — J40 Bronchitis, not specified as acute or chronic: Secondary | ICD-10-CM | POA: Diagnosis not present

## 2022-06-21 DIAGNOSIS — J441 Chronic obstructive pulmonary disease with (acute) exacerbation: Secondary | ICD-10-CM | POA: Diagnosis not present

## 2022-07-21 DIAGNOSIS — J441 Chronic obstructive pulmonary disease with (acute) exacerbation: Secondary | ICD-10-CM | POA: Diagnosis not present

## 2022-08-23 DIAGNOSIS — J441 Chronic obstructive pulmonary disease with (acute) exacerbation: Secondary | ICD-10-CM | POA: Diagnosis not present

## 2022-09-14 DIAGNOSIS — J441 Chronic obstructive pulmonary disease with (acute) exacerbation: Secondary | ICD-10-CM | POA: Diagnosis not present

## 2022-10-04 DIAGNOSIS — J449 Chronic obstructive pulmonary disease, unspecified: Secondary | ICD-10-CM | POA: Diagnosis not present

## 2022-10-04 DIAGNOSIS — J9811 Atelectasis: Secondary | ICD-10-CM | POA: Diagnosis not present

## 2022-10-12 DIAGNOSIS — L989 Disorder of the skin and subcutaneous tissue, unspecified: Secondary | ICD-10-CM | POA: Diagnosis not present

## 2022-10-12 DIAGNOSIS — Y92009 Unspecified place in unspecified non-institutional (private) residence as the place of occurrence of the external cause: Secondary | ICD-10-CM | POA: Diagnosis not present

## 2022-10-12 DIAGNOSIS — M542 Cervicalgia: Secondary | ICD-10-CM | POA: Diagnosis not present

## 2022-10-12 DIAGNOSIS — W010XXA Fall on same level from slipping, tripping and stumbling without subsequent striking against object, initial encounter: Secondary | ICD-10-CM | POA: Diagnosis not present

## 2022-10-18 DIAGNOSIS — H16223 Keratoconjunctivitis sicca, not specified as Sjogren's, bilateral: Secondary | ICD-10-CM | POA: Diagnosis not present

## 2022-10-25 DIAGNOSIS — I480 Paroxysmal atrial fibrillation: Secondary | ICD-10-CM | POA: Diagnosis not present

## 2022-10-25 DIAGNOSIS — I442 Atrioventricular block, complete: Secondary | ICD-10-CM | POA: Diagnosis not present

## 2022-10-25 DIAGNOSIS — I2510 Atherosclerotic heart disease of native coronary artery without angina pectoris: Secondary | ICD-10-CM | POA: Diagnosis not present

## 2022-10-25 DIAGNOSIS — I340 Nonrheumatic mitral (valve) insufficiency: Secondary | ICD-10-CM | POA: Diagnosis not present

## 2022-10-31 DIAGNOSIS — J40 Bronchitis, not specified as acute or chronic: Secondary | ICD-10-CM | POA: Diagnosis not present

## 2022-10-31 DIAGNOSIS — J4489 Other specified chronic obstructive pulmonary disease: Secondary | ICD-10-CM | POA: Diagnosis not present

## 2022-11-21 ENCOUNTER — Emergency Department: Payer: Medicare HMO

## 2022-11-21 ENCOUNTER — Other Ambulatory Visit: Payer: Self-pay

## 2022-11-21 ENCOUNTER — Emergency Department
Admission: EM | Admit: 2022-11-21 | Discharge: 2022-11-21 | Disposition: A | Discharge status: Home / Self Care | Payer: Medicare HMO | Attending: Emergency Medicine | Admitting: Emergency Medicine

## 2022-11-21 ENCOUNTER — Encounter: Payer: Self-pay | Admitting: *Deleted

## 2022-11-21 DIAGNOSIS — W1830XA Fall on same level, unspecified, initial encounter: Secondary | ICD-10-CM | POA: Diagnosis not present

## 2022-11-21 DIAGNOSIS — W19XXXA Unspecified fall, initial encounter: Secondary | ICD-10-CM

## 2022-11-21 DIAGNOSIS — S8001XA Contusion of right knee, initial encounter: Secondary | ICD-10-CM | POA: Diagnosis not present

## 2022-11-21 DIAGNOSIS — Z96651 Presence of right artificial knee joint: Secondary | ICD-10-CM | POA: Diagnosis not present

## 2022-11-21 DIAGNOSIS — S59901A Unspecified injury of right elbow, initial encounter: Secondary | ICD-10-CM | POA: Diagnosis not present

## 2022-11-21 DIAGNOSIS — S5001XA Contusion of right elbow, initial encounter: Secondary | ICD-10-CM | POA: Diagnosis not present

## 2022-11-21 DIAGNOSIS — S51011A Laceration without foreign body of right elbow, initial encounter: Secondary | ICD-10-CM | POA: Insufficient documentation

## 2022-11-21 DIAGNOSIS — M779 Enthesopathy, unspecified: Secondary | ICD-10-CM | POA: Diagnosis not present

## 2022-11-21 DIAGNOSIS — S8991XA Unspecified injury of right lower leg, initial encounter: Secondary | ICD-10-CM | POA: Diagnosis not present

## 2022-11-21 NOTE — ED Provider Notes (Signed)
[facility] Provider Note  Patient Contact: 9:24 PM (approximate)   History   Fall   HPI  Natalie Schneider is a 89 y.o. female who presents emergency department after mechanical fall.  Patient dropped a coffee mug, broken the ground she bent over to pick up, lost balance and fell.  Patient reports that she did not hit her head or lose consciousness.  Witnessed fall with confirmation that patient did not hit her head or lose consciousness.  Patient's family is with her and states that she is acting her normal self.  She had no complaints prior to the fall, simply lost balance when trying to pick up the pieces of the coffee mug.  She is primarily here because she sustained superficial skin tears to the right elbow and knee and had some swelling to her knee.  Patient still ambulatory.  No other complaint.     Physical Exam   Triage Vital Signs: ED Triage Vitals [11/21/22 2010]  Enc Vitals Group     BP (!) 173/74     Pulse Rate 70     Resp 18     Temp 98.2 F (36.8 C)     Temp Source Oral     SpO2 96 %     Weight 145 lb (65.8 kg)     Height 4\' 11"  (1.499 m)     Head Circumference      Peak Flow      Pain Score 5     Pain Loc      Pain Edu?      Excl. in GC?     Most recent vital signs: Vitals:   11/21/22 2010  BP: (!) 173/74  Pulse: 70  Resp: 18  Temp: 98.2 F (36.8 C)  SpO2: 96%     General: Alert and in no acute distress. Eyes:  PERRL. EOMI. Head: No acute traumatic findings  Neck: No stridor. No cervical spine tenderness to palpation.  Cardiovascular:  Good peripheral perfusion Respiratory: Normal respiratory effort without tachypnea or retractions. Lungs CTAB. Musculoskeletal: Full range of motion to all extremities.  Superficial skin tear to the right elbow.  No active bleeding.  No visible foreign body.  Full range of motion to the underlying joint.  No tenderness to the osseous structures of the underlying joint.  Examination of the  right knee reveals some ecchymosis along the side of the knee.  No open wounds.  Full range of motion patient is still able to bear weight on the knee. Neurologic:  No gross focal neurologic deficits are appreciated.  No nerves II through XII grossly intact. Skin:   No rash noted Other:   ED Results / Procedures / Treatments   Labs (all labs ordered are listed, but only abnormal results are displayed) Labs Reviewed - No data to display   EKG     RADIOLOGY  I personally viewed, evaluated, and interpreted these images as part of my medical decision making, as well as reviewing the written report by the radiologist.  ED Provider Interpretation: No acute traumatic findings to the elbow or knee  DG Knee Complete 4 Views Right  Result Date: 11/21/2022 CLINICAL DATA:  Trauma, fall EXAM: RIGHT KNEE - COMPLETE 4+ VIEW COMPARISON:  None Available. FINDINGS: There is previous right knee arthroplasty. No recent fracture or dislocation is seen. There is no effusion in suprapatellar bursa. IMPRESSION: No recent fracture or dislocation is seen. Previous right knee arthroplasty. Electronically Signed  By: Ernie Avena M.D.   On: 11/21/2022 20:57   DG Elbow Complete Right  Result Date: 11/21/2022 CLINICAL DATA:  Trauma, fall EXAM: RIGHT ELBOW - COMPLETE 3+ VIEW COMPARISON:  None Available. FINDINGS: No displaced fracture or dislocation is seen. There is 1 mm smoothly marginated calcification at the attachment of triceps tendon to the olecranon process. Small linear calcification is noted adjacent to the head of the proximal radius. Small bony spurs are seen in coronoid and olecranon processes in proximal ulna. There is no displacement of posterior fat pad. IMPRESSION: No recent displaced fracture or dislocation is seen. Small calcifications adjacent to olecranon process of proximal ulna and head of the proximal radius may be the sequela from previous injury. Bony spurs are seen in proximal ulna.  Electronically Signed   By: Ernie Avena M.D.   On: 11/21/2022 20:55    PROCEDURES:  Critical Care performed: No  Procedures   MEDICATIONS ORDERED IN ED: Medications - No data to display   IMPRESSION / MDM / ASSESSMENT AND PLAN / ED COURSE  I reviewed the triage vital signs and the nursing notes.                                 Differential diagnosis includes, but is not limited to, head injury, cervical spine injury, knee fracture, knee contusion, elbow fracture, skin tear   Patient's presentation is most consistent with acute presentation with potential threat to life or bodily function.   Patient's diagnosis is consistent with fall, contusion of the elbow and knee, skin tear of the elbow.  Patient presents emergency department with her family after mechanical fall.  She dropped a glass that broke.  While patient had been bent over trying to pick up the glass pieces she fell injuring her elbow and knee.  Patient had a witnessed fall with no evidence of head injury.  She proclaims that she did not hit her head.  At this time she declines imaging of her head and neck.  Feel this is reasonable as it was witnessed, she has no complaints there is no signs of trauma to the head or neck.  X-rays of the elbow and knee were obtained out of triage.  No evidence of underlying fracture.  Superficial skin tear manage in the emergency department.  Wound care instructions discussed with the patient.  Follow-up primary care as needed.   Patient is given ED precautions to return to the ED for any worsening or new symptoms.     FINAL CLINICAL IMPRESSION(S) / ED DIAGNOSES   Final diagnoses:  Fall, initial encounter  Contusion of right elbow, initial encounter  Contusion of right knee, initial encounter  Skin tear of right elbow without complication, initial encounter     Rx / DC Orders   ED Discharge Orders     None        Note:  This document was prepared using Dragon voice  recognition software and may include unintentional dictation errors.   Lanette Hampshire 11/21/22 2129    Jene Every, MD 11/21/22 2132

## 2022-11-21 NOTE — ED Triage Notes (Signed)
Pt brought in by daughter for a fall.  Pt has abrasion to right elbow and bruising to right knee.  Pt did not have walker when she fell.  No loc   no neck or back pain.   Pt alert.

## 2022-11-24 DIAGNOSIS — J441 Chronic obstructive pulmonary disease with (acute) exacerbation: Secondary | ICD-10-CM | POA: Diagnosis not present

## 2022-11-24 DIAGNOSIS — I5032 Chronic diastolic (congestive) heart failure: Secondary | ICD-10-CM | POA: Diagnosis not present

## 2022-12-19 DIAGNOSIS — Z03818 Encounter for observation for suspected exposure to other biological agents ruled out: Secondary | ICD-10-CM | POA: Diagnosis not present

## 2022-12-19 DIAGNOSIS — J441 Chronic obstructive pulmonary disease with (acute) exacerbation: Secondary | ICD-10-CM | POA: Diagnosis not present

## 2022-12-19 DIAGNOSIS — R6889 Other general symptoms and signs: Secondary | ICD-10-CM | POA: Diagnosis not present

## 2023-01-10 DIAGNOSIS — J441 Chronic obstructive pulmonary disease with (acute) exacerbation: Secondary | ICD-10-CM | POA: Diagnosis not present

## 2023-03-13 DIAGNOSIS — J441 Chronic obstructive pulmonary disease with (acute) exacerbation: Secondary | ICD-10-CM | POA: Diagnosis not present

## 2023-03-21 DIAGNOSIS — I442 Atrioventricular block, complete: Secondary | ICD-10-CM | POA: Diagnosis not present

## 2023-03-23 DIAGNOSIS — Z23 Encounter for immunization: Secondary | ICD-10-CM | POA: Diagnosis not present

## 2023-03-23 DIAGNOSIS — J441 Chronic obstructive pulmonary disease with (acute) exacerbation: Secondary | ICD-10-CM | POA: Diagnosis not present

## 2023-04-03 DIAGNOSIS — J441 Chronic obstructive pulmonary disease with (acute) exacerbation: Secondary | ICD-10-CM | POA: Diagnosis not present

## 2023-04-10 DIAGNOSIS — Z961 Presence of intraocular lens: Secondary | ICD-10-CM | POA: Diagnosis not present

## 2023-04-10 DIAGNOSIS — H524 Presbyopia: Secondary | ICD-10-CM | POA: Diagnosis not present

## 2023-05-04 DIAGNOSIS — J441 Chronic obstructive pulmonary disease with (acute) exacerbation: Secondary | ICD-10-CM | POA: Diagnosis not present

## 2023-05-11 DIAGNOSIS — J441 Chronic obstructive pulmonary disease with (acute) exacerbation: Secondary | ICD-10-CM | POA: Diagnosis not present

## 2023-05-23 DIAGNOSIS — I480 Paroxysmal atrial fibrillation: Secondary | ICD-10-CM | POA: Diagnosis not present

## 2023-05-23 DIAGNOSIS — J441 Chronic obstructive pulmonary disease with (acute) exacerbation: Secondary | ICD-10-CM | POA: Diagnosis not present

## 2023-05-23 DIAGNOSIS — I5032 Chronic diastolic (congestive) heart failure: Secondary | ICD-10-CM | POA: Diagnosis not present

## 2023-05-23 DIAGNOSIS — J40 Bronchitis, not specified as acute or chronic: Secondary | ICD-10-CM | POA: Diagnosis not present

## 2023-06-08 DIAGNOSIS — I25118 Atherosclerotic heart disease of native coronary artery with other forms of angina pectoris: Secondary | ICD-10-CM | POA: Diagnosis not present

## 2023-06-08 DIAGNOSIS — I442 Atrioventricular block, complete: Secondary | ICD-10-CM | POA: Diagnosis not present

## 2023-06-08 DIAGNOSIS — I495 Sick sinus syndrome: Secondary | ICD-10-CM | POA: Diagnosis not present

## 2023-06-08 DIAGNOSIS — R600 Localized edema: Secondary | ICD-10-CM | POA: Diagnosis not present

## 2023-06-08 DIAGNOSIS — I447 Left bundle-branch block, unspecified: Secondary | ICD-10-CM | POA: Diagnosis not present

## 2023-06-08 DIAGNOSIS — I714 Abdominal aortic aneurysm, without rupture, unspecified: Secondary | ICD-10-CM | POA: Diagnosis not present

## 2023-06-08 DIAGNOSIS — I480 Paroxysmal atrial fibrillation: Secondary | ICD-10-CM | POA: Diagnosis not present

## 2023-06-08 DIAGNOSIS — I700 Atherosclerosis of aorta: Secondary | ICD-10-CM | POA: Diagnosis not present

## 2023-06-08 DIAGNOSIS — I340 Nonrheumatic mitral (valve) insufficiency: Secondary | ICD-10-CM | POA: Diagnosis not present

## 2023-06-08 DIAGNOSIS — I10 Essential (primary) hypertension: Secondary | ICD-10-CM | POA: Diagnosis not present

## 2023-06-23 DIAGNOSIS — J449 Chronic obstructive pulmonary disease, unspecified: Secondary | ICD-10-CM | POA: Diagnosis not present

## 2023-06-23 DIAGNOSIS — Z03818 Encounter for observation for suspected exposure to other biological agents ruled out: Secondary | ICD-10-CM | POA: Diagnosis not present

## 2023-06-23 DIAGNOSIS — J42 Unspecified chronic bronchitis: Secondary | ICD-10-CM | POA: Diagnosis not present

## 2023-06-30 DIAGNOSIS — H903 Sensorineural hearing loss, bilateral: Secondary | ICD-10-CM | POA: Diagnosis not present

## 2023-06-30 DIAGNOSIS — H6123 Impacted cerumen, bilateral: Secondary | ICD-10-CM | POA: Diagnosis not present

## 2023-07-19 DIAGNOSIS — I480 Paroxysmal atrial fibrillation: Secondary | ICD-10-CM | POA: Diagnosis not present

## 2023-07-19 DIAGNOSIS — K219 Gastro-esophageal reflux disease without esophagitis: Secondary | ICD-10-CM | POA: Diagnosis not present

## 2023-07-19 DIAGNOSIS — J209 Acute bronchitis, unspecified: Secondary | ICD-10-CM | POA: Diagnosis not present

## 2023-07-19 DIAGNOSIS — J441 Chronic obstructive pulmonary disease with (acute) exacerbation: Secondary | ICD-10-CM | POA: Diagnosis not present

## 2023-09-05 DIAGNOSIS — I495 Sick sinus syndrome: Secondary | ICD-10-CM | POA: Diagnosis not present

## 2023-09-05 DIAGNOSIS — Z950 Presence of cardiac pacemaker: Secondary | ICD-10-CM | POA: Diagnosis not present

## 2023-09-05 DIAGNOSIS — I442 Atrioventricular block, complete: Secondary | ICD-10-CM | POA: Diagnosis not present

## 2023-09-06 DIAGNOSIS — I480 Paroxysmal atrial fibrillation: Secondary | ICD-10-CM | POA: Diagnosis not present

## 2023-09-06 DIAGNOSIS — J441 Chronic obstructive pulmonary disease with (acute) exacerbation: Secondary | ICD-10-CM | POA: Diagnosis not present

## 2023-09-06 DIAGNOSIS — I442 Atrioventricular block, complete: Secondary | ICD-10-CM | POA: Diagnosis not present

## 2023-09-06 DIAGNOSIS — I5032 Chronic diastolic (congestive) heart failure: Secondary | ICD-10-CM | POA: Diagnosis not present

## 2023-09-29 DIAGNOSIS — R21 Rash and other nonspecific skin eruption: Secondary | ICD-10-CM | POA: Diagnosis not present

## 2023-09-29 DIAGNOSIS — M545 Low back pain, unspecified: Secondary | ICD-10-CM | POA: Diagnosis not present

## 2023-09-29 DIAGNOSIS — J441 Chronic obstructive pulmonary disease with (acute) exacerbation: Secondary | ICD-10-CM | POA: Diagnosis not present

## 2023-10-16 DIAGNOSIS — Z03818 Encounter for observation for suspected exposure to other biological agents ruled out: Secondary | ICD-10-CM | POA: Diagnosis not present

## 2023-10-16 DIAGNOSIS — J40 Bronchitis, not specified as acute or chronic: Secondary | ICD-10-CM | POA: Diagnosis not present

## 2023-10-17 DIAGNOSIS — J441 Chronic obstructive pulmonary disease with (acute) exacerbation: Secondary | ICD-10-CM | POA: Diagnosis not present

## 2023-10-17 DIAGNOSIS — B359 Dermatophytosis, unspecified: Secondary | ICD-10-CM | POA: Diagnosis not present

## 2023-10-17 NOTE — Progress Notes (Signed)
 Natalie Schneider is a 89 y.o. female here for    CHIEF COMPLAINT:    Patient Active Problem List  Diagnosis  . Chronic obstructive pulmonary disease with acute exacerbation (CMS/HHS-HCC)  . LBBB (left bundle branch block)  . OA (osteoarthritis)  . Osteoporosis  . Anxiety  . Aortic aneurysm ()  . Atrophic vulvitis  . Prediabetes  . Moderate mitral insufficiency  . Paroxysmal A-fib (CMS/HHS-HCC)  . Primary osteoarthritis of left knee  . Status post total right knee replacement  . Atherosclerosis of abdominal aorta ()  . Benign essential hypertension  . Status post total left knee replacement  . Sinoatrial node dysfunction  . Atrioventricular block, complete (CMS/HHS-HCC)  . Presence of artificial knee joint  . History of total left knee replacement  . Healthcare maintenance  . Ambulates with cane  . Chronic diastolic CHF (congestive heart failure) (CMS/HHS-HCC)  . Drug allergy  . Bilateral leg edema  . CAD (coronary artery disease)     HISTORY OF PRESENT ILLNESS:  SILA SARSFIELD complains of several days of cough and worsening sob, feels like her usual bronchitis flair. Also a circular lesion with raised border. Putting an unknown cream on it   Past Medical History:  Diagnosis Date  . A-fib (CMS/HHS-HCC) 10/19/2013  . Adnexal mass    cystic  . Allergic state   . Anxiety 10/19/2013  . Aortic aneurysm ()   . Atrophic vulvitis   . AVB (atrioventricular block)   . Benign breast lumps   . Cardiomyopathy (CMS/HHS-HCC) 10/19/2013  . CHF (congestive heart failure) (CMS/HHS-HCC)   . Cholelithiasis   . COPD (chronic obstructive pulmonary disease) (CMS/HHS-HCC) 10/19/2013  . COVID-19 02/2020  . Diverticulosis   . GERD (gastroesophageal reflux disease)   . Glucose intolerance (impaired glucose tolerance)   . Hiatal hernia   . HTN (hypertension), benign 10/19/2013  . LBBB (left bundle branch block) 10/19/2013  . LBBB (left bundle branch block)   . Mitral regurgitation   . OA  (osteoarthritis) 10/19/2013  . Osteoporosis 10/19/2013  . Shingles   . Thyroid  disease     Past Surgical History:  Procedure Laterality Date  . COLONOSCOPY  07/08/2004  . left total knee arthroplasty Left 11/05/2014   Dr.kernodle   . Left breast cyst    . Left oopherectomy and questionable right performed 07/2008 by Dr. Carola Pinal    . Partial Throidectomy   1955    . Right total knee arthroplasty 05/24/96 ([facility])       No fever chills or sweats   No nausea, vomiting or diarrhea  No chest pain, shortness of breath   Social History   Socioeconomic History  . Marital status: Widowed  Tobacco Use  . Smoking status: Former    Current packs/day: 0.00    Average packs/day: 1 pack/day for 1 year (1.0 ttl pk-yrs)    Types: Cigarettes    Start date: 04/15/1948    Quit date: 04/15/1949    Years since quitting: 74.5  . Smokeless tobacco: Never  Vaping Use  . Vaping status: Never Used  Substance and Sexual Activity  . Alcohol use: No    Alcohol/week: 0.0 standard drinks of alcohol  . Drug use: Defer  . Sexual activity: Defer   Social Drivers of Health   Financial Resource Strain: Low Risk  (06/23/2023)   Overall Financial Resource Strain (CARDIA)   . Difficulty of Paying Living Expenses: Not hard at all  Food Insecurity: No Food Insecurity (06/23/2023)  Hunger Vital Sign   . Worried About Programme researcher, broadcasting/film/video in the Last Year: Never true   . Ran Out of Food in the Last Year: Never true  Transportation Needs: No Transportation Needs (06/23/2023)   PRAPARE - Transportation   . Lack of Transportation (Medical): No   . Lack of Transportation (Non-Medical): No  Housing Stability: Unknown (09/06/2023)   Housing Stability Vital Sign   . Unable to Pay for Housing in the Last Year: No   . Homeless in the Last Year: No      Current Outpatient Medications:  .  acetaminophen  (TYLENOL ) 325 MG tablet, Take 325 mg by mouth once daily as needed   , Disp: , Rfl:  .  acyclovir (ZOVIRAX) 800 MG  tablet, Take 1 tablet (800 mg total) by mouth 3 (three) times daily as needed (use for the first 3 days of a fever blister), Disp: 30 tablet, Rfl: 2 .  albuterol MDI, PROVENTIL, VENTOLIN, PROAIR, HFA 90 mcg/actuation inhaler, Inhale 2 inhalations into the lungs every 6 (six) hours as needed for Wheezing, Disp: 1 each, Rfl: 2 .  busPIRone  (BUSPAR ) 5 MG tablet, Take 2 tablets (10 mg total) by mouth 2 (two) times daily, Disp: 120 tablet, Rfl: 5 .  clotrimazole-betamethasone (LOTRISONE) 1-0.05 % cream, Apply topically 2 (two) times daily, Disp: 45 g, Rfl: 1 .  digoxin  (LANOXIN ) 0.125 MG tablet, TAKE 1 TABLET BY MOUTH DAILY, Disp: 90 tablet, Rfl: 3 .  diphenhydrAMINE  (BENADRYL ) 25 mg capsule, Take 25 mg by mouth once daily, Disp: , Rfl:  .  ELIQUIS 5 mg tablet, TAKE 1 TABLET BY MOUTH TWICE A DAY, Disp: 180 tablet, Rfl: 3 .  fluticasone  propion-salmeteroL (ADVAIR DISKUS) 250-50 mcg/dose diskus inhaler, Inhale 1 Puff into the lungs every 12 (twelve) hours, Disp: 60 each, Rfl: 2 .  fluticasone  propionate (FLONASE ) 50 mcg/actuation nasal spray, Place 2 sprays into both nostrils once daily, Disp: 16 g, Rfl: 5 .  FUROsemide (LASIX) 80 MG tablet, take 1 tablet by mouth once daily., Disp: 30 tablet, Rfl: 11 .  loperamide (IMODIUM) 2 mg capsule, Take by mouth 3 (three) times daily as needed. Reported on 06/25/2015 , Disp: , Rfl:  .  losartan  (COZAAR ) 100 MG tablet, take 1 tablet by mouth daily, Disp: 90 tablet, Rfl: 1 .  melatonin 1 mg tablet, Take 1 mg by mouth nightly, Disp: , Rfl:  .  metoprolol  SUCCinate (TOPROL -XL) 100 MG XL tablet, TAKE 1 TABLET BY MOUTH DAILY, Disp: 90 tablet, Rfl: 1 .  montelukast  (SINGULAIR ) 10 mg tablet, TAKE 1 TABLET BY MOUTH AT BEDTIME, Disp: 30 tablet, Rfl: 5 .  pantoprazole  (PROTONIX ) 40 MG DR tablet, Take 1 tablet (40 mg total) by mouth once daily, Disp: 90 tablet, Rfl: 2 .  polyethylene glycol (MIRALAX ) powder, Take 17 g by mouth once daily Mix in 4-8ounces of fluid prior to taking.,  Disp: , Rfl:  .  potassium chloride  (KLOR-CON ) 10 MEQ ER tablet, TAKE 1 TABLET BY MOUTH DAILY, Disp: 30 tablet, Rfl: 11 .  fluconazole (DIFLUCAN) 100 MG tablet, Take 1 tablet (100 mg total) by mouth once daily for 7 days, Disp: 7 tablet, Rfl: 0 .  predniSONE (DELTASONE) 20 MG tablet, Take 1 tablet (20 mg total) by mouth once daily, Disp: 7 tablet, Rfl: 0  Vitals:   10/17/23 1631  BP: 138/74  Pulse: 92  Resp: 16  Temp: 36.6 C (97.9 F)   Body mass index is 29.26 kg/m. No acute distress HEENT:  Normocephalic atraumatic. TM's clear. Op clear.  Lungs; clear to ascultation Heart; Regular rate and rhythm  Abdomen; Soft and flat, normal bowel sounds Extremities; No clubbing, cyanosis or edema  Appointment on 10/16/2023  Component Date Value Ref Range Status  . Influenza A PCR 10/16/2023 Negative  Negative Final  . Influenza B PCR 10/16/2023 Negative  Negative Final  . RSV PCR 10/16/2023 Negative  Negative Final  . SARS-CoV2 PCR 10/16/2023 Negative  Negative Final    ASSESSMENT  AND PLAN:  Diagnoses and all orders for this visit:  Chronic obstructive pulmonary disease with acute exacerbation (CMS/HHS-HCC)  Tinea  Other orders -     fluconazole (DIFLUCAN) 100 MG tablet; Take 1 tablet (100 mg total) by mouth once daily for 7 days -     predniSONE (DELTASONE) 20 MG tablet; Take 1 tablet (20 mg total) by mouth once daily   Extended swab was negative, mild flair but she doesn't do well without prednisone

## 2023-10-31 DIAGNOSIS — H903 Sensorineural hearing loss, bilateral: Secondary | ICD-10-CM | POA: Diagnosis not present

## 2023-11-10 DIAGNOSIS — H903 Sensorineural hearing loss, bilateral: Secondary | ICD-10-CM | POA: Diagnosis not present

## 2023-11-13 DIAGNOSIS — I480 Paroxysmal atrial fibrillation: Secondary | ICD-10-CM | POA: Diagnosis not present

## 2023-11-13 DIAGNOSIS — J441 Chronic obstructive pulmonary disease with (acute) exacerbation: Secondary | ICD-10-CM | POA: Diagnosis not present

## 2023-11-13 DIAGNOSIS — I5032 Chronic diastolic (congestive) heart failure: Secondary | ICD-10-CM | POA: Diagnosis not present

## 2023-11-17 ENCOUNTER — Emergency Department
Admission: EM | Admit: 2023-11-17 | Discharge: 2023-11-17 | Disposition: A | Discharge status: Home / Self Care | Attending: Emergency Medicine | Admitting: Emergency Medicine

## 2023-11-17 ENCOUNTER — Other Ambulatory Visit: Payer: Self-pay

## 2023-11-17 DIAGNOSIS — R609 Edema, unspecified: Secondary | ICD-10-CM | POA: Diagnosis not present

## 2023-11-17 DIAGNOSIS — I10 Essential (primary) hypertension: Secondary | ICD-10-CM | POA: Diagnosis not present

## 2023-11-17 DIAGNOSIS — I83891 Varicose veins of right lower extremities with other complications: Secondary | ICD-10-CM | POA: Insufficient documentation

## 2023-11-17 DIAGNOSIS — I83811 Varicose veins of right lower extremities with pain: Secondary | ICD-10-CM | POA: Diagnosis not present

## 2023-11-17 DIAGNOSIS — R58 Hemorrhage, not elsewhere classified: Secondary | ICD-10-CM | POA: Diagnosis not present

## 2023-11-17 DIAGNOSIS — W19XXXA Unspecified fall, initial encounter: Secondary | ICD-10-CM | POA: Diagnosis not present

## 2023-11-17 NOTE — ED Triage Notes (Addendum)
 Pt in via [facility] for a fall that happened monday night that resulted in brusiing in her left leg and arm. Pt called EMS fro oozing and bleeding. Upon EMS assessment no oozing or bleeding observed. Per pt she wanted to get checked out. No bleeding or oozing observed by this RN. Pt does take Eliquis.

## 2023-11-17 NOTE — ED Provider Notes (Signed)
 EKG is interpreted by me at 730 heart rate 65 QRS 160 QTc 430 Ventricular pacing   Dicky Anes, MD 11/17/23 [phone]

## 2023-11-17 NOTE — ED Provider Notes (Signed)
 [facility] Provider Note    Event Date/Time   First MD Initiated Contact with Patient 11/17/23 [phone]     (approximate)   History   Fall (Fall happened Monday)   HPI  Natalie Schneider is a 89 y.o. female   reports that she fell on Monday but suffered no injury of the bruising.  She has been doing fine but has noticed with the warmer weather and also when she bumped her leg that she is seeing a very small amount of bleeding and a little bit of fluid draining from her right lower leg.  She reports this happened before possibly from varicose vein.  Reports of small amount of bleeding from a tiny little spot on her right shin.  She has not any pain or discomfort.  She has not hit her head has no neck pain.        Physical Exam   Triage Vital Signs: ED Triage Vitals  Encounter Vitals Group     BP 11/17/23 0721 (!) 170/129     Girls Systolic BP Percentile --      Girls Diastolic BP Percentile --      Boys Systolic BP Percentile --      Boys Diastolic BP Percentile --      Pulse Rate 11/17/23 0719 71     Resp 11/17/23 0719 18     Temp 11/17/23 0721 98.2 F (36.8 C)     Temp Source 11/17/23 0721 Oral     SpO2 11/17/23 0719 97 %     Weight 11/17/23 0720 142 lb 11.2 oz (64.7 kg)     Height 11/17/23 0720 4' 11 (1.499 m)     Head Circumference --      Peak Flow --      Pain Score 11/17/23 0720 0     Pain Loc --      Pain Education --      Exclude from Growth Chart --     Most recent vital signs: Vitals:   11/17/23 0721 11/17/23 0726  BP: (!) 170/129 (!) 165/74  Pulse:    Resp:    Temp: 98.2 F (36.8 C)   SpO2:       General: Awake, no distress.  Normocephalic atraumatic no bruising.  Alert oriented very pleasant moves all extremities well CV:  Good peripheral perfusion.  Resp:  Normal effort.  Clear bilateral Abd:  No distention.  Soft nontender Other:  She has bruising over the right forearm with ecchymosis.  No deformity demonstrates  normal range of motion reports it does not hurt or painful.  Shows me the area of concern over her right anterior shin.  She has a punctate area that has slight clear serous drainage from it but she reports it was bleeding small amounts of blood last night.  There is no surrounding erythema or redness there is no induration or swelling in the area.  It appears to be what could have been a small varicosity/veinule.  Does not appear infected   ED Results / Procedures / Treatments   Labs (all labs ordered are listed, but only abnormal results are displayed) Labs Reviewed - No data to display   EKG     RADIOLOGY     PROCEDURES:  Critical Care performed: No  Wound closure utilizing adhes only  Date/Time: 11/17/2023 8:01 AM  Performed by: Dicky Anes, MD Authorized by: Dicky Anes, MD  Consent: Verbal consent obtained Risks and benefits: risks, benefits  and alternatives were discussed Consent given by: patient Patient understanding: patient states understanding of the procedure being performed Patient identity confirmed: verbally with patient Local anesthesia used: no  Anesthesia: Local anesthesia used: no  Sedation: Patient sedated: no  Comments: Small area of Dermabond applied directly over the punctate area that was draining.  Intendant Leatrice not here is dressing is placed.  No complications no further bleeding or drainage.      MEDICATIONS ORDERED IN ED: Medications - No data to display   IMPRESSION / MDM / ASSESSMENT AND PLAN / ED COURSE  I reviewed the triage vital signs and the nursing notes.                              Differential diagnosis includes, but is not limited to, what appears to be a small perhaps earlier bleeding varicose vein or small cyst.  I have closed it with Dermabond and applied nonadherent dressing.  Patient will continue to monitor due to bandage and dressing changes.  There is no evidence of superinfection.  She is fully awake and  alert reports she had a fall about 5 days ago with no head injury and has some bruising but no evidence of obvious injury.   Patient's presentation is most consistent with acute, uncomplicated illness.   Return precautions and treatment recommendations and follow-up discussed with the patient who is agreeable with the plan.        FINAL CLINICAL IMPRESSION(S) / ED DIAGNOSES   Final diagnoses:  Varicose veins of right lower extremity with other complications     Rx / DC Orders   ED Discharge Orders     None        Note:  This document was prepared using Dragon voice recognition software and may include unintentional dictation errors.   Dicky Anes, MD 11/17/23 [phone]

## 2023-11-20 DIAGNOSIS — I5032 Chronic diastolic (congestive) heart failure: Secondary | ICD-10-CM | POA: Diagnosis not present

## 2023-11-20 DIAGNOSIS — I8311 Varicose veins of right lower extremity with inflammation: Secondary | ICD-10-CM | POA: Diagnosis not present

## 2023-11-20 DIAGNOSIS — Z09 Encounter for follow-up examination after completed treatment for conditions other than malignant neoplasm: Secondary | ICD-10-CM | POA: Diagnosis not present

## 2023-11-20 DIAGNOSIS — I480 Paroxysmal atrial fibrillation: Secondary | ICD-10-CM | POA: Diagnosis not present

## 2023-12-04 ENCOUNTER — Emergency Department

## 2023-12-04 ENCOUNTER — Other Ambulatory Visit: Payer: Self-pay

## 2023-12-04 ENCOUNTER — Emergency Department
Admission: EM | Admit: 2023-12-04 | Discharge: 2023-12-04 | Disposition: A | Discharge status: Home / Self Care | Attending: Emergency Medicine | Admitting: Emergency Medicine

## 2023-12-04 DIAGNOSIS — Z7901 Long term (current) use of anticoagulants: Secondary | ICD-10-CM | POA: Insufficient documentation

## 2023-12-04 DIAGNOSIS — J329 Chronic sinusitis, unspecified: Secondary | ICD-10-CM | POA: Diagnosis not present

## 2023-12-04 DIAGNOSIS — R9082 White matter disease, unspecified: Secondary | ICD-10-CM | POA: Diagnosis not present

## 2023-12-04 DIAGNOSIS — M5030 Other cervical disc degeneration, unspecified cervical region: Secondary | ICD-10-CM | POA: Diagnosis not present

## 2023-12-04 DIAGNOSIS — Z96652 Presence of left artificial knee joint: Secondary | ICD-10-CM | POA: Diagnosis not present

## 2023-12-04 DIAGNOSIS — J449 Chronic obstructive pulmonary disease, unspecified: Secondary | ICD-10-CM | POA: Insufficient documentation

## 2023-12-04 DIAGNOSIS — S01112A Laceration without foreign body of left eyelid and periocular area, initial encounter: Secondary | ICD-10-CM | POA: Insufficient documentation

## 2023-12-04 DIAGNOSIS — Z043 Encounter for examination and observation following other accident: Secondary | ICD-10-CM | POA: Diagnosis not present

## 2023-12-04 DIAGNOSIS — I110 Hypertensive heart disease with heart failure: Secondary | ICD-10-CM | POA: Insufficient documentation

## 2023-12-04 DIAGNOSIS — S0990XA Unspecified injury of head, initial encounter: Secondary | ICD-10-CM | POA: Diagnosis not present

## 2023-12-04 DIAGNOSIS — M419 Scoliosis, unspecified: Secondary | ICD-10-CM | POA: Diagnosis not present

## 2023-12-04 DIAGNOSIS — W1809XA Striking against other object with subsequent fall, initial encounter: Secondary | ICD-10-CM | POA: Diagnosis not present

## 2023-12-04 DIAGNOSIS — M47812 Spondylosis without myelopathy or radiculopathy, cervical region: Secondary | ICD-10-CM | POA: Diagnosis not present

## 2023-12-04 DIAGNOSIS — W19XXXA Unspecified fall, initial encounter: Secondary | ICD-10-CM

## 2023-12-04 DIAGNOSIS — S0993XA Unspecified injury of face, initial encounter: Secondary | ICD-10-CM | POA: Diagnosis not present

## 2023-12-04 DIAGNOSIS — M85852 Other specified disorders of bone density and structure, left thigh: Secondary | ICD-10-CM | POA: Diagnosis not present

## 2023-12-04 DIAGNOSIS — I509 Heart failure, unspecified: Secondary | ICD-10-CM | POA: Insufficient documentation

## 2023-12-04 DIAGNOSIS — I6782 Cerebral ischemia: Secondary | ICD-10-CM | POA: Diagnosis not present

## 2023-12-04 DIAGNOSIS — I10 Essential (primary) hypertension: Secondary | ICD-10-CM | POA: Diagnosis not present

## 2023-12-04 DIAGNOSIS — Z471 Aftercare following joint replacement surgery: Secondary | ICD-10-CM | POA: Diagnosis not present

## 2023-12-04 DIAGNOSIS — M79605 Pain in left leg: Secondary | ICD-10-CM | POA: Diagnosis not present

## 2023-12-04 NOTE — ED Triage Notes (Addendum)
 Pt arrived via EMS for a mechanical fall. Per EMS pt walker rolled out from under5 her and pt fell and hit her head. No laceration or bleeding from wound on pt left tempol. Pt is at baseline at this time. A/Ox4, NAD. Pt does take blood thinners. Pt sts that she did not pass out.

## 2023-12-04 NOTE — Discharge Instructions (Signed)
 Your workup was reassuring.  You should return to the ER if develop worsening symptoms fevers or any other concerns but at this time there is no signs of bleed in the brain or fractures.

## 2023-12-04 NOTE — ED Provider Notes (Signed)
 [facility] Provider Note    Event Date/Time   First MD Initiated Contact with Patient 12/04/23 1109     (approximate)   History   Fall   HPI  MIONNA ADVINCULA is a 89 y.o. female COPD, A-fib on Eliquis, CHF, hypertension who comes in for mechanical fall patient's walker rolled out from under her and she fell and hit her head but did have a wound to the left temporal.  Is on a blood thinner therefore patient transported for evaluation.  Patient reports a pacemaker but she denies any chest pain, shortness of breath, syncopal symptoms.  That she reports coming in due to the laceration above her left eye.  Patient reports it was a mechanical fall.  She states that she was trying to pull on her walker and then use her hand to use a broom to try to brush out stuff from the floor.  She reports that she lost her balance with this and fell forward and onto her left side.  I reviewed the note from 11/20/2023 where patient has a history of A-fib which she takes Eliquis for  Tdap in 2022   Physical Exam   Triage Vital Signs: ED Triage Vitals  Encounter Vitals Group     BP 12/04/23 1102 (!) 181/77     Girls Systolic BP Percentile --      Girls Diastolic BP Percentile --      Boys Systolic BP Percentile --      Boys Diastolic BP Percentile --      Pulse Rate 12/04/23 1102 80     Resp 12/04/23 1102 17     Temp 12/04/23 1102 98.2 F (36.8 C)     Temp Source 12/04/23 1102 Oral     SpO2 12/04/23 1102 99 %     Weight 12/04/23 1101 142 lb 11.2 oz (64.7 kg)     Height --      Head Circumference --      Peak Flow --      Pain Score 12/04/23 1101 4     Pain Loc --      Pain Education --      Exclude from Growth Chart --     Most recent vital signs: Vitals:   12/04/23 1102  BP: (!) 181/77  Pulse: 80  Resp: 17  Temp: 98.2 F (36.8 C)  SpO2: 99%     General: Awake, no distress.  CV:  Good peripheral perfusion.  Resp:  Normal effort.  Soft and  nontender Abd:  No distention.  Soft and nontender Other:  1 cm laceration above the left eye Some tenderness on the left knee maybe a little bit on the left hip but still able to raise her legs bilaterally  ED Results / Procedures / Treatments   Labs (all labs ordered are listed, but only abnormal results are displayed) Labs Reviewed - No data to display   EKG  My interpretation of EKG:    RADIOLOGY I have reviewed the xray personally and interpreted no evidence of fracture   PROCEDURES:  Critical Care performed: No  .Laceration Repair  Date/Time: 12/04/2023 12:23 PM  Performed by: Ernest Ronal BRAVO, MD Authorized by: Ernest Ronal BRAVO, MD   Consent:    Consent obtained:  Verbal   Consent given by:  Patient   Risks discussed:  Infection and pain   Alternatives discussed:  No treatment Laceration details:    Location:  Face   Face  location:  L eyebrow   Length (cm):  1 Treatment:    Area cleansed with:  Saline   Irrigation method:  Syringe Skin repair:    Repair method:  Tissue adhesive Approximation:    Approximation:  Close Repair type:    Repair type:  Simple Post-procedure details:    Dressing:  Open (no dressing)   Procedure completion:  Tolerated well, no immediate complications    MEDICATIONS ORDERED IN ED: Medications - No data to display   IMPRESSION / MDM / ASSESSMENT AND PLAN / ED COURSE  I reviewed the triage vital signs and the nursing notes.   Patient's presentation is most consistent with acute presentation with potential threat to life or bodily function.   Differential is intracranial hemorrhage, facial fracture, cervical fracture, extremity fracture will proceed with CT imaging.  No chest wall tenderness no abdominal tenderness  IMPRESSION: 1. No fracture. 2. Diffuse osteopenia.   IMPRESSION: 1. No evidence of acute traumatic injury. 2. Minimal paranasal sinus disease.  CTs, x-ray are reassuring patient is ambulatory with walker  repeat abdominal exam soft and nontender.  Glue placed above the left eyebrow.  They feel comfortable with discharge home and family is at bedside    FINAL CLINICAL IMPRESSION(S) / ED DIAGNOSES   Final diagnoses:  Fall, initial encounter  Laceration of left eyebrow, initial encounter     Rx / DC Orders   ED Discharge Orders     None        Note:  This document was prepared using Dragon voice recognition software and may include unintentional dictation errors.   Ernest Ronal BRAVO, MD 12/04/23 1226

## 2023-12-19 DIAGNOSIS — J441 Chronic obstructive pulmonary disease with (acute) exacerbation: Secondary | ICD-10-CM | POA: Diagnosis not present

## 2023-12-19 DIAGNOSIS — I442 Atrioventricular block, complete: Secondary | ICD-10-CM | POA: Diagnosis not present

## 2024-02-01 DIAGNOSIS — J441 Chronic obstructive pulmonary disease with (acute) exacerbation: Secondary | ICD-10-CM | POA: Diagnosis not present

## 2024-02-27 DIAGNOSIS — I480 Paroxysmal atrial fibrillation: Secondary | ICD-10-CM | POA: Diagnosis not present

## 2024-02-27 DIAGNOSIS — J441 Chronic obstructive pulmonary disease with (acute) exacerbation: Secondary | ICD-10-CM | POA: Diagnosis not present

## 2024-03-13 DIAGNOSIS — J441 Chronic obstructive pulmonary disease with (acute) exacerbation: Secondary | ICD-10-CM | POA: Diagnosis not present

## 2024-03-13 DIAGNOSIS — I480 Paroxysmal atrial fibrillation: Secondary | ICD-10-CM | POA: Diagnosis not present

## 2024-03-13 DIAGNOSIS — R197 Diarrhea, unspecified: Secondary | ICD-10-CM | POA: Diagnosis not present

## 2024-03-14 DIAGNOSIS — K219 Gastro-esophageal reflux disease without esophagitis: Secondary | ICD-10-CM | POA: Diagnosis not present

## 2024-03-14 DIAGNOSIS — J301 Allergic rhinitis due to pollen: Secondary | ICD-10-CM | POA: Diagnosis not present

## 2024-03-14 DIAGNOSIS — Z23 Encounter for immunization: Secondary | ICD-10-CM | POA: Diagnosis not present

## 2024-03-14 DIAGNOSIS — J441 Chronic obstructive pulmonary disease with (acute) exacerbation: Secondary | ICD-10-CM | POA: Diagnosis not present

## 2024-03-14 DIAGNOSIS — J9801 Acute bronchospasm: Secondary | ICD-10-CM | POA: Diagnosis not present

## 2024-03-14 DIAGNOSIS — R053 Chronic cough: Secondary | ICD-10-CM | POA: Diagnosis not present

## 2024-04-02 DIAGNOSIS — J209 Acute bronchitis, unspecified: Secondary | ICD-10-CM | POA: Diagnosis not present

## 2024-04-08 DIAGNOSIS — J209 Acute bronchitis, unspecified: Secondary | ICD-10-CM | POA: Diagnosis not present

## 2024-04-19 ENCOUNTER — Other Ambulatory Visit: Payer: Self-pay

## 2024-04-19 ENCOUNTER — Emergency Department

## 2024-04-19 ENCOUNTER — Emergency Department
Admission: EM | Admit: 2024-04-19 | Discharge: 2024-04-19 | Disposition: A | Discharge status: Home / Self Care | Attending: Emergency Medicine | Admitting: Emergency Medicine

## 2024-04-19 DIAGNOSIS — Z950 Presence of cardiac pacemaker: Secondary | ICD-10-CM | POA: Insufficient documentation

## 2024-04-19 DIAGNOSIS — R079 Chest pain, unspecified: Secondary | ICD-10-CM | POA: Diagnosis not present

## 2024-04-19 DIAGNOSIS — I110 Hypertensive heart disease with heart failure: Secondary | ICD-10-CM | POA: Insufficient documentation

## 2024-04-19 DIAGNOSIS — J449 Chronic obstructive pulmonary disease, unspecified: Secondary | ICD-10-CM | POA: Insufficient documentation

## 2024-04-19 DIAGNOSIS — I10 Essential (primary) hypertension: Secondary | ICD-10-CM | POA: Diagnosis not present

## 2024-04-19 DIAGNOSIS — Z96659 Presence of unspecified artificial knee joint: Secondary | ICD-10-CM | POA: Diagnosis not present

## 2024-04-19 DIAGNOSIS — R0789 Other chest pain: Secondary | ICD-10-CM | POA: Insufficient documentation

## 2024-04-19 DIAGNOSIS — I509 Heart failure, unspecified: Secondary | ICD-10-CM | POA: Diagnosis not present

## 2024-04-19 DIAGNOSIS — R0689 Other abnormalities of breathing: Secondary | ICD-10-CM | POA: Diagnosis not present

## 2024-04-19 DIAGNOSIS — R0902 Hypoxemia: Secondary | ICD-10-CM | POA: Diagnosis not present

## 2024-04-19 DIAGNOSIS — R000 Tachycardia, unspecified: Secondary | ICD-10-CM | POA: Diagnosis not present

## 2024-04-19 LAB — COMPREHENSIVE METABOLIC PANEL WITH GFR
ALT: 18 U/L (ref 0–44)
AST: 31 U/L (ref 15–41)
Albumin: 4.1 g/dL (ref 3.5–5.0)
Alkaline Phosphatase: 63 U/L (ref 38–126)
Anion gap: 10 (ref 5–15)
BUN: 16 mg/dL (ref 8–23)
CO2: 26 mmol/L (ref 22–32)
Calcium: 9.9 mg/dL (ref 8.9–10.3)
Chloride: 96 mmol/L — ABNORMAL LOW (ref 98–111)
Creatinine, Ser: 0.62 mg/dL (ref 0.44–1.00)
GFR, Estimated: >60 mL/min (ref >60)
Glucose, Bld: 93 mg/dL (ref 70–99)
Potassium: 4.5 mmol/L (ref 3.5–5.1)
Sodium: 132 mmol/L — ABNORMAL LOW (ref 135–145)
Total Bilirubin: 1 mg/dL (ref 0.0–1.2)
Total Protein: 6.5 g/dL (ref 6.5–8.1)

## 2024-04-19 LAB — CBC WITH DIFFERENTIAL/PLATELET
Abs Immature Granulocytes: 0.06 K/uL (ref 0.00–0.07)
Basophils Absolute: 0.1 K/uL (ref 0.0–0.1)
Basophils Relative: 1 %
Eosinophils Absolute: 0.3 K/uL (ref 0.0–0.5)
Eosinophils Relative: 5 %
HCT: 36.7 % (ref 36.0–46.0)
Hemoglobin: 13.2 g/dL (ref 12.0–15.0)
Immature Granulocytes: 1 %
Lymphocytes Relative: 32 %
Lymphs Abs: 1.6 K/uL (ref 0.7–4.0)
MCH: 33.8 pg (ref 26.0–34.0)
MCHC: 36 g/dL (ref 30.0–36.0)
MCV: 93.9 fL (ref 80.0–100.0)
Monocytes Absolute: 0.6 K/uL (ref 0.1–1.0)
Monocytes Relative: 12 %
Neutro Abs: 2.5 K/uL (ref 1.7–7.7)
Neutrophils Relative %: 49 %
Platelets: 130 K/uL — ABNORMAL LOW (ref 150–400)
RBC: 3.91 MIL/uL (ref 3.87–5.11)
RDW: 13.8 % (ref 11.5–15.5)
WBC: 5.1 K/uL (ref 4.0–10.5)
nRBC: 0 % (ref 0.0–0.2)

## 2024-04-19 LAB — TROPONIN T, HIGH SENSITIVITY
Troponin T High Sensitivity: 19 ng/L (ref 0–19)
Troponin T High Sensitivity: 30 ng/L — ABNORMAL HIGH (ref 0–19)

## 2024-04-19 LAB — PRO BRAIN NATRIURETIC PEPTIDE: Pro Brain Natriuretic Peptide: 673 pg/mL — ABNORMAL HIGH (ref <300.0)

## 2024-04-19 NOTE — ED Notes (Signed)
 Patient Medtronic Advisa device interrogated. Unable to print from iPad. Dr. Jossie reviewed uploaded report from iPad for [employer] interrogations.

## 2024-04-19 NOTE — ED Provider Notes (Signed)
 [facility] Provider Note   Event Date/Time   First MD Initiated Contact with Patient 04/19/24 1628     (approximate) History  Chest Pain  HPI Natalie Schneider is a 89 y.o. female with a past medical history of total knee replacement, complete heart block status post pacemaker in place, hypertension, CHF, and COPD who presents complaining of intermittent right-sided chest pain that is nonradiating and has no exacerbating or relieving factors.  Patient states that she has chest pain daily but it is normally on the left side and associated with exertion.  Patient states that this right sided chest pain is brand-new for her however even at its maximal she describes a 3/10, sharp, lower anterior right chest wall pain that is nonradiating and does not worsen with inspiration or exertion.  Patient denies any chest pain at this time ROS: Patient currently denies any vision changes, tinnitus, difficulty speaking, facial droop, sore throat, shortness of breath, abdominal pain, nausea/vomiting/diarrhea, dysuria, or weakness/numbness/paresthesias in any extremity   Physical Exam  Triage Vital Signs: ED Triage Vitals  Encounter Vitals Group     BP 04/19/24 1627 (!) 184/103     Girls Systolic BP Percentile --      Girls Diastolic BP Percentile --      Boys Systolic BP Percentile --      Boys Diastolic BP Percentile --      Pulse Rate 04/19/24 1627 73     Resp 04/19/24 1627 16     Temp 04/19/24 1627 97.9 F (36.6 C)     Temp Source 04/19/24 1627 Oral     SpO2 04/19/24 1627 97 %     Weight 04/19/24 1630 151 lb 7.3 oz (68.7 kg)     Height 04/19/24 1630 4' 11 (1.499 m)     Head Circumference --      Peak Flow --      Pain Score 04/19/24 1628 0     Pain Loc --      Pain Education --      Exclude from Growth Chart --    Most recent vital signs: Vitals:   04/19/24 1932 04/19/24 1940  BP:  (!) 191/88  Pulse:  72  Resp:  (!) 23  Temp:  98 F (36.7 C)  SpO2: 98% 97%    General: Awake, oriented x4. CV:  Good peripheral perfusion. Resp:  Normal effort. Abd:  No distention. Other:  Elderly overweight Caucasian female resting comfortably in no acute distress ED Results / Procedures / Treatments  Labs (all labs ordered are listed, but only abnormal results are displayed) Labs Reviewed  COMPREHENSIVE METABOLIC PANEL WITH GFR - Abnormal; Notable for the following components:      Result Value   Sodium 132 (*)    Chloride 96 (*)    All other components within normal limits  CBC WITH DIFFERENTIAL/PLATELET - Abnormal; Notable for the following components:   Platelets 130 (*)    All other components within normal limits  PRO BRAIN NATRIURETIC PEPTIDE - Abnormal; Notable for the following components:   Pro Brain Natriuretic Peptide 673.0 (*)    All other components within normal limits  TROPONIN T, HIGH SENSITIVITY - Abnormal; Notable for the following components:   Troponin T High Sensitivity 30 (*)    All other components within normal limits  TROPONIN T, HIGH SENSITIVITY   EKG ED ECG REPORT I, Artist MARLA Kerns, the attending physician, personally viewed and interpreted this ECG. Date: 04/19/2024  EKG Time: 1631 Rate: 75 Rhythm: Paced rhythm QRS Axis: normal Intervals: normal ST/T Wave abnormalities: normal Narrative Interpretation: Paced rhythm with no evidence of acute ischemia RADIOLOGY ED MD interpretation: One-view portable chest x-ray interpreted by me shows no evidence of acute abnormalities including no pneumonia, pneumothorax, or widened mediastinum - All radiology independently interpreted and agree with radiology assessment Official radiology report(s): DG Chest Port 1 View Result Date: 04/19/2024 EXAM: 1 VIEW(S) XRAY OF THE CHEST 04/19/2024 05:19:20 PM COMPARISON: 09/25/2020. CLINICAL HISTORY: Chest pain. FINDINGS: LUNGS AND PLEURA: No focal pulmonary opacity. No pleural effusion. No pneumothorax. HEART AND MEDIASTINUM: Left pacer  remains in place, unchanged. BONES AND SOFT TISSUES: No acute osseous abnormality. IMPRESSION: 1. No acute cardiopulmonary process. Electronically signed by: Franky Crease MD 04/19/2024 05:23 PM EST RP Workstation: [id number]   PROCEDURES: Critical Care performed: No Procedures MEDICATIONS ORDERED IN ED: Medications - No data to display IMPRESSION / MDM / ASSESSMENT AND PLAN / ED COURSE  I reviewed the triage vital signs and the nursing notes.                             The patient is on the cardiac monitor to evaluate for evidence of arrhythmia and/or significant heart rate changes. Patient's presentation is most consistent with acute presentation with potential threat to life or bodily function. Patient is a 89 year old female with the above-stated past medical history who presents complaining of intermittent right-sided chest pain that is different from her normal daily left-sided chest pain DDx: ACS, pleural effusion, pulmonary edema, musculoskeletal injury Plan: CBC, CMP, troponin, BNP, chest x-ray, EKG  Laboratory radiologic valuations x-ray evidence of acute abnormalities.  Patient's pacemaker interrogation only showed the last abnormal rhythm being on November 4 and therefore likely not correlated with this pain that she is having here today.  Patient's pain is well-controlled at this time and agrees with plan for discharge and cardiology follow-up in the outpatient setting.  Patient states that she has an appointment coming up with cardiology if this chest pain persists.  Patient given strict return precautions and all questions answered prior to discharge  Dispo: Discharge home with cardiology and PCP follow-up as needed   FINAL CLINICAL IMPRESSION(S) / ED DIAGNOSES   Final diagnoses:  Intermittent right-sided chest pain   Rx / DC Orders   ED Discharge Orders     None      Note:  This document was prepared using Dragon voice recognition software and may include  unintentional dictation errors.   Linc Renne K, MD 04/19/24 2135

## 2024-04-19 NOTE — ED Notes (Addendum)
 Pt has family sitting at bedside, brough the pt a warm blanket, falls bundle applied,assisted pt to the toilet, changed her brief and walked her back to bed SB assist, she usually has a walker at home, no other needs at this time

## 2024-04-19 NOTE — ED Triage Notes (Signed)
 Pt to ED via [facility] from home for c/o intermittent right-sided chest pain that is non-radiating. Pt denies pain at this time. Pt has pacemaker. A&O, NAD

## 2024-05-13 ENCOUNTER — Other Ambulatory Visit: Payer: Self-pay | Admitting: Internal Medicine

## 2024-05-13 DIAGNOSIS — Z8781 Personal history of (healed) traumatic fracture: Secondary | ICD-10-CM

## 2024-05-14 ENCOUNTER — Encounter: Payer: Self-pay | Admitting: Internal Medicine

## 2024-05-15 ENCOUNTER — Other Ambulatory Visit (HOSPITAL_COMMUNITY): Payer: Self-pay | Admitting: Internal Medicine

## 2024-05-15 DIAGNOSIS — Z8781 Personal history of (healed) traumatic fracture: Secondary | ICD-10-CM

## 2024-05-21 ENCOUNTER — Other Ambulatory Visit (HOSPITAL_COMMUNITY): Payer: Self-pay | Admitting: Internal Medicine

## 2024-05-21 DIAGNOSIS — Z950 Presence of cardiac pacemaker: Secondary | ICD-10-CM

## 2024-06-12 NOTE — Procedures (Signed)
 Pacer Clinic Monitoring. See chart review for scanned document of complete results and uploaded device report.

## 2024-06-20 NOTE — CV Procedure (Signed)
" °  Device system confirmed to be MRI conditional, with implant date > 6 weeks ago, and no evidence of abandoned or epicardial leads in review of most recent CXR  Device last cleared by EP Provider: Prentice Passey 06/20/2024  Clearance is good through for 1 year as long as parameters remain stable at time of check. If pt undergoes a cardiac device procedure during that time, they should be re-cleared.   Tachy-therapies to be programmed off if applicable with device back to pre-MRI settings after completion of exam.  [employer] - Programming recommendation received through [employer] App/Tablet  Rocky Catalan, RT  06/20/2024 11:35 AM     "

## 2024-06-25 ENCOUNTER — Ambulatory Visit (HOSPITAL_COMMUNITY): Admission: RE | Admit: 2024-06-25 | Source: Ambulatory Visit

## 2024-06-25 ENCOUNTER — Other Ambulatory Visit (HOSPITAL_COMMUNITY)

## 2024-07-02 ENCOUNTER — Encounter: Admission: RE | Payer: Self-pay | Source: Home / Self Care

## 2024-07-02 ENCOUNTER — Ambulatory Visit: Admission: RE | Admit: 2024-07-02 | Admitting: Cardiology

## 2024-07-02 DIAGNOSIS — Z45010 Encounter for checking and testing of cardiac pacemaker pulse generator [battery]: Secondary | ICD-10-CM
